# Patient Record
Sex: Female | Born: 1965 | Race: White | Hispanic: No | Smoking: Never smoker
Health system: Southern US, Community
[De-identification: ages and names within clinical notes are randomized; demographics above are authoritative.]

## PROBLEM LIST (undated history)

## (undated) DIAGNOSIS — Z8709 Personal history of other diseases of the respiratory system: Secondary | ICD-10-CM

## (undated) DIAGNOSIS — J302 Other seasonal allergic rhinitis: Secondary | ICD-10-CM

## (undated) DIAGNOSIS — B019 Varicella without complication: Secondary | ICD-10-CM

## (undated) DIAGNOSIS — H3320 Serous retinal detachment, unspecified eye: Secondary | ICD-10-CM

## (undated) DIAGNOSIS — N390 Urinary tract infection, site not specified: Secondary | ICD-10-CM

## (undated) DIAGNOSIS — R51 Headache: Secondary | ICD-10-CM

## (undated) DIAGNOSIS — E039 Hypothyroidism, unspecified: Secondary | ICD-10-CM

## (undated) DIAGNOSIS — R519 Headache, unspecified: Secondary | ICD-10-CM

## (undated) HISTORY — PX: ELBOW SURGERY: SHX618

## (undated) HISTORY — DX: Urinary tract infection, site not specified: N39.0

## (undated) HISTORY — DX: Other seasonal allergic rhinitis: J30.2

## (undated) HISTORY — DX: Varicella without complication: B01.9

## (undated) HISTORY — PX: TUBAL LIGATION: SHX77

---

## 2007-11-28 ENCOUNTER — Emergency Department (HOSPITAL_COMMUNITY): Admission: EM | Admit: 2007-11-28 | Discharge: 2007-11-28 | Payer: Self-pay | Admitting: Emergency Medicine

## 2010-03-21 ENCOUNTER — Emergency Department (HOSPITAL_COMMUNITY): Admission: EM | Admit: 2010-03-21 | Discharge: 2010-03-21 | Payer: Self-pay | Admitting: Emergency Medicine

## 2010-07-02 ENCOUNTER — Ambulatory Visit (HOSPITAL_COMMUNITY): Admission: RE | Admit: 2010-07-02 | Payer: Self-pay | Source: Home / Self Care | Admitting: Obstetrics and Gynecology

## 2010-09-19 LAB — URINE CULTURE
Colony Count: >=100000
Culture  Setup Time: 201109152007

## 2010-09-19 LAB — COMPREHENSIVE METABOLIC PANEL WITH GFR
ALT: 17 U/L (ref 0–35)
AST: 21 U/L (ref 0–37)
Albumin: 3.9 g/dL (ref 3.5–5.2)
Alkaline Phosphatase: 78 U/L (ref 39–117)
BUN: 8 mg/dL (ref 6–23)
CO2: 23 meq/L (ref 19–32)
Calcium: 9.4 mg/dL (ref 8.4–10.5)
Chloride: 108 meq/L (ref 96–112)
Creatinine, Ser: 0.82 mg/dL (ref 0.4–1.2)
GFR calc Af Amer: >60 mL/min (ref >60)
GFR calc non Af Amer: >60 mL/min (ref >60)
Glucose, Bld: 83 mg/dL (ref 70–99)
Potassium: 3.6 meq/L (ref 3.5–5.1)
Sodium: 139 meq/L (ref 135–145)
Total Bilirubin: 0.4 mg/dL (ref 0.3–1.2)
Total Protein: 7.2 g/dL (ref 6.0–8.3)

## 2010-09-19 LAB — PREGNANCY, URINE: Preg Test, Ur: NEGATIVE

## 2010-09-19 LAB — CBC
HCT: 39.4 % (ref 36.0–46.0)
Hemoglobin: 13.8 g/dL (ref 12.0–15.0)
MCH: 30.7 pg (ref 26.0–34.0)
MCHC: 35 g/dL (ref 30.0–36.0)
MCV: 87.6 fL (ref 78.0–100.0)
Platelets: 338 K/uL (ref 150–400)
RBC: 4.5 MIL/uL (ref 3.87–5.11)
RDW: 13.1 % (ref 11.5–15.5)
WBC: 10.7 K/uL — ABNORMAL HIGH (ref 4.0–10.5)

## 2010-09-19 LAB — DIFFERENTIAL
Basophils Absolute: 0.1 K/uL (ref 0.0–0.1)
Basophils Relative: 1 % (ref 0–1)
Eosinophils Absolute: 0.2 K/uL (ref 0.0–0.7)
Eosinophils Relative: 2 % (ref 0–5)
Lymphocytes Relative: 27 % (ref 12–46)
Lymphs Abs: 2.9 K/uL (ref 0.7–4.0)
Monocytes Absolute: 0.8 K/uL (ref 0.1–1.0)
Monocytes Relative: 8 % (ref 3–12)
Neutro Abs: 6.8 K/uL (ref 1.7–7.7)
Neutrophils Relative %: 63 % (ref 43–77)

## 2010-09-19 LAB — GC/CHLAMYDIA PROBE AMP, GENITAL
Chlamydia, DNA Probe: NEGATIVE
GC Probe Amp, Genital: NEGATIVE

## 2010-09-19 LAB — URINALYSIS, ROUTINE W REFLEX MICROSCOPIC
Bilirubin Urine: NEGATIVE
Specific Gravity, Urine: 1.008 (ref 1.005–1.030)
pH: 6.5 (ref 5.0–8.0)

## 2010-09-19 LAB — URINE MICROSCOPIC-ADD ON

## 2010-09-19 LAB — LIPASE, BLOOD: Lipase: 25 U/L (ref 11–59)

## 2010-09-19 LAB — WET PREP, GENITAL: Clue Cells Wet Prep HPF POC: NONE SEEN

## 2011-06-13 ENCOUNTER — Encounter: Payer: Self-pay | Admitting: Emergency Medicine

## 2011-06-13 ENCOUNTER — Emergency Department (HOSPITAL_COMMUNITY)
Admission: EM | Admit: 2011-06-13 | Discharge: 2011-06-13 | Disposition: A | Discharge status: Home / Self Care | Payer: Self-pay | Attending: Emergency Medicine | Admitting: Emergency Medicine

## 2011-06-13 ENCOUNTER — Emergency Department (HOSPITAL_COMMUNITY): Payer: Self-pay

## 2011-06-13 DIAGNOSIS — J069 Acute upper respiratory infection, unspecified: Secondary | ICD-10-CM | POA: Insufficient documentation

## 2011-06-13 DIAGNOSIS — J40 Bronchitis, not specified as acute or chronic: Secondary | ICD-10-CM | POA: Insufficient documentation

## 2011-06-13 DIAGNOSIS — R079 Chest pain, unspecified: Secondary | ICD-10-CM | POA: Insufficient documentation

## 2011-06-13 MED ORDER — PROMETHAZINE-DM 6.25-15 MG/5ML PO SYRP: 5.0000 mL | ORAL_SOLUTION | Freq: Four times a day (QID) | ORAL | Status: AC | PRN

## 2011-06-13 MED ORDER — PREDNISONE 50 MG PO TABS: 50.0000 mg | ORAL_TABLET | Freq: Every day | ORAL | Status: AC

## 2011-06-13 MED ORDER — GUAIFENESIN ER 1200 MG PO TB12: 1.0000 | ORAL_TABLET | Freq: Two times a day (BID) | ORAL | Status: DC

## 2011-06-13 NOTE — ED Provider Notes (Signed)
History     CSN: [id number] Arrival date & time: 06/13/2011 12:31 PM   First MD Initiated Contact with Patient 06/13/11 1355      Chief Complaint  Patient presents with  . Cough  . Otalgia  . Nausea    (Consider location/radiation/quality/duration/timing/severity/associated sxs/prior treatment) The history is provided by the patient.    45 y.o. female presents to the emergency room c/o fever, headache, cough, congestion, otalgia, and sore throat x 3 days.  Denies nausea, vomiting, diarrhea, abdominal pain.  Pt admits to chest wall soreness with coughing and mild shortness of breath on exertion.  No sick contacts.  Decreased PO intake, fatigue and myalgias.   Past Medical History  Diagnosis Date  . Asthma     Past Surgical History  Procedure Date  . Elbow surgery   . Tubal ligation     History reviewed. No pertinent family history.  History  Substance Use Topics  . Smoking status: Never Smoker   . Smokeless tobacco: Not on file  . Alcohol Use: No    OB History    Grav Para Term Preterm Abortions TAB SAB Ect Mult Living                  Review of Systems  All pertinent positives and negatives in the history of present illness  Allergies  Aspirin and Codeine  Home Medications   Current Outpatient Rx  Name Route Sig Dispense Refill  . ACETAMINOPHEN 80 MG PO CHEW Oral Chew 160 mg by mouth every 4 (four) hours as needed. pain     . ALBUTEROL SULFATE HFA 108 (90 BASE) MCG/ACT IN AERS Inhalation Inhale 2 puffs into the lungs every 6 (six) hours as needed. Shortness of breath     . PHENYLEPHRINE-DM-GG 2.5-5-100 MG/5ML PO LIQD Oral Take 10 mLs by mouth every 6 (six) hours as needed. Cough/congestion       BP 150/85  Pulse 120  Temp(Src) 98.4 F (36.9 C) (Oral)  Resp 16  SpO2 97%  LMP 05/13/2011  Physical Exam  Constitutional: She is oriented to person, place, and time. She appears well-developed and well-nourished.  HENT:  Head: Normocephalic and  atraumatic.  Right Ear: Tympanic membrane, external ear and ear canal normal.  Left Ear: Tympanic membrane, external ear and ear canal normal.  Nose: Mucosal edema and rhinorrhea present. No sinus tenderness. No epistaxis. Right sinus exhibits maxillary sinus tenderness and frontal sinus tenderness. Left sinus exhibits maxillary sinus tenderness and frontal sinus tenderness.  Mouth/Throat: Uvula is midline, oropharynx is clear and moist and mucous membranes are normal.  Eyes: Pupils are equal, round, and reactive to light.  Neck: Normal range of motion. No JVD present.  Cardiovascular: Normal rate, regular rhythm and normal heart sounds.   Pulmonary/Chest: Effort normal and breath sounds normal. No accessory muscle usage. No respiratory distress. She has no wheezes. She has no rhonchi. She has no rales. She exhibits tenderness (chest wall sore with inspiration).  Abdominal: Soft. Bowel sounds are normal.  Lymphadenopathy:    She has no cervical adenopathy.  Neurological: She is alert and oriented to person, place, and time.  Skin: Skin is warm and dry.  Psychiatric: She has a normal mood and affect. Her behavior is normal. Judgment and thought content normal.    ED Course  Procedures (including critical care time)     Patient is a negative chest x-ray he will be treated for viral upper respiratory tract infection.  She does have a  history of asthmatic bronchitis which she is on albuterol inhaler.      MDM  Viral URI based on her history of present illness and physical exam        Carlyle Dolly, PA-C 06/14/11 [phone]

## 2011-06-13 NOTE — ED Notes (Addendum)
Pt said that her symptoms started Tuesday with a  Fever, body aches, and coughs. She feels nauseated because she coughs and back pain when she coughs. Hx of bronchial asthma.

## 2011-06-16 NOTE — ED Provider Notes (Signed)
Medical screening examination/treatment/procedure(s) were performed by non-physician practitioner and as supervising physician I was immediately available for consultation/collaboration.  Doug Sou, MD 06/16/11 [phone]

## 2014-07-25 ENCOUNTER — Encounter: Payer: Self-pay | Admitting: Certified Nurse Midwife

## 2014-08-31 ENCOUNTER — Encounter: Payer: Self-pay | Admitting: Nurse Practitioner

## 2014-09-13 ENCOUNTER — Other Ambulatory Visit: Payer: Self-pay | Admitting: Obstetrics and Gynecology

## 2014-09-13 DIAGNOSIS — R928 Other abnormal and inconclusive findings on diagnostic imaging of breast: Secondary | ICD-10-CM

## 2014-09-19 ENCOUNTER — Ambulatory Visit
Admission: RE | Admit: 2014-09-19 | Discharge: 2014-09-19 | Disposition: A | Discharge status: Home / Self Care | Payer: 59 | Source: Ambulatory Visit | Attending: Obstetrics and Gynecology | Admitting: Obstetrics and Gynecology

## 2014-09-19 DIAGNOSIS — R928 Other abnormal and inconclusive findings on diagnostic imaging of breast: Secondary | ICD-10-CM

## 2014-10-16 ENCOUNTER — Ambulatory Visit (INDEPENDENT_AMBULATORY_CARE_PROVIDER_SITE_OTHER): Payer: 59 | Admitting: Family

## 2014-10-16 ENCOUNTER — Telehealth: Payer: Self-pay | Admitting: Family

## 2014-10-16 ENCOUNTER — Encounter: Payer: Self-pay | Admitting: Family

## 2014-10-16 ENCOUNTER — Other Ambulatory Visit (INDEPENDENT_AMBULATORY_CARE_PROVIDER_SITE_OTHER): Payer: 59

## 2014-10-16 VITALS — BP 128/88 | HR 78 | Temp 98.5°F | Resp 18 | Ht 61.75 in | Wt 175.8 lb

## 2014-10-16 DIAGNOSIS — R946 Abnormal results of thyroid function studies: Secondary | ICD-10-CM | POA: Diagnosis not present

## 2014-10-16 DIAGNOSIS — J4520 Mild intermittent asthma, uncomplicated: Secondary | ICD-10-CM | POA: Diagnosis not present

## 2014-10-16 DIAGNOSIS — E039 Hypothyroidism, unspecified: Secondary | ICD-10-CM | POA: Insufficient documentation

## 2014-10-16 DIAGNOSIS — R7989 Other specified abnormal findings of blood chemistry: Secondary | ICD-10-CM

## 2014-10-16 DIAGNOSIS — R21 Rash and other nonspecific skin eruption: Secondary | ICD-10-CM | POA: Diagnosis not present

## 2014-10-16 DIAGNOSIS — J45909 Unspecified asthma, uncomplicated: Secondary | ICD-10-CM | POA: Insufficient documentation

## 2014-10-16 LAB — TSH: TSH: 5.59 u[IU]/mL — AB (ref 0.35–4.50)

## 2014-10-16 LAB — T4: T4, Total: 7.3 ug/dL (ref 4.5–12.0)

## 2014-10-16 MED ORDER — ALBUTEROL SULFATE HFA 108 (90 BASE) MCG/ACT IN AERS: 1.0000 | INHALATION_SPRAY | Freq: Four times a day (QID) | RESPIRATORY_TRACT | Status: DC | PRN

## 2014-10-16 MED ORDER — SYNTHROID 25 MCG PO TABS: 25.0000 ug | ORAL_TABLET | Freq: Every day | ORAL | Status: DC

## 2014-10-16 NOTE — Progress Notes (Signed)
Pre visit review using our clinic review tool, if applicable. No additional management support is needed unless otherwise documented below in the visit note. 

## 2014-10-16 NOTE — Telephone Encounter (Signed)
Pt aware of results and she is aware to follow up in 6 weeks.

## 2014-10-16 NOTE — Patient Instructions (Signed)
Thank you for choosing [facility].  Summary/Instructions:  Your prescription(s) have been submitted to your pharmacy or been printed and provided for you. Please take as directed and contact our office if you believe you are having problem(s) with the medication(s) or have any questions.  Please stop by the lab on the basement level of the building for your blood work. Your results will be released to [facility] (or called to you) after review, usually within 72 hours after test completion. If any changes need to be made, you will be notified at that same time.  If your symptoms worsen or fail to improve, please contact our office for further instruction, or in case of emergency go directly to the emergency room at the closest medical facility.    Hypothyroidism The thyroid is a large gland located in the lower front of your neck. The thyroid gland helps control metabolism. Metabolism is how your body handles food. It controls metabolism with the hormone thyroxine. When this gland is underactive (hypothyroid), it produces too little hormone.  CAUSES These include:   Absence or destruction of thyroid tissue.  Goiter due to iodine deficiency.  Goiter due to medications.  Congenital defects (since birth).  Problems with the pituitary. This causes a lack of TSH (thyroid stimulating hormone). This hormone tells the thyroid to turn out more hormone. SYMPTOMS  Lethargy (feeling as though you have no energy)  Cold intolerance  Weight gain (in spite of normal food intake)  Dry skin  Coarse hair  Menstrual irregularity (if severe, may lead to infertility)  Slowing of thought processes Cardiac problems are also caused by insufficient amounts of thyroid hormone. Hypothyroidism in the newborn is cretinism, and is an extreme form. It is important that this form be treated adequately and immediately or it will lead rapidly to retarded physical and mental development. DIAGNOSIS  To  prove hypothyroidism, your caregiver may do blood tests and ultrasound tests. Sometimes the signs are hidden. It may be necessary for your caregiver to watch this illness with blood tests either before or after diagnosis and treatment. TREATMENT  Low levels of thyroid hormone are increased by using synthetic thyroid hormone. This is a safe, effective treatment. It usually takes about four weeks to gain the full effects of the medication. After you have the full effect of the medication, it will generally take another four weeks for problems to leave. Your caregiver may start you on low doses. If you have had heart problems the dose may be gradually increased. It is generally not an emergency to get rapidly to normal. HOME CARE INSTRUCTIONS   Take your medications as your caregiver suggests. Let your caregiver know of any medications you are taking or start taking. Your caregiver will help you with dosage schedules.  As your condition improves, your dosage needs may increase. It will be necessary to have continuing blood tests as suggested by your caregiver.  Report all suspected medication side effects to your caregiver. SEEK MEDICAL CARE IF: Seek medical care if you develop:  Sweating.  Tremulousness (tremors).  Anxiety.  Rapid weight loss.  Heat intolerance.  Emotional swings.  Diarrhea.  Weakness. SEEK IMMEDIATE MEDICAL CARE IF:  You develop chest pain, an irregular heart beat (palpitations), or a rapid heart beat. MAKE SURE YOU:   Understand these instructions.  Will watch your condition.  Will get help right away if you are not doing well or get worse. Document Released: 06/23/2005 Document Revised: 09/15/2011 Document Reviewed: 02/11/2008 ExitCare Patient  Information 2015 [employer]. This information is not intended to replace advice given to you by your health care provider. Make sure you discuss any questions you have with your health care provider.  

## 2014-10-16 NOTE — Assessment & Plan Note (Signed)
Stable and controlled with albuterol. Continue current dosage of albuterol. Medication refill.

## 2014-10-16 NOTE — Assessment & Plan Note (Signed)
Previously noted elevated TSH from her GYN office. Obtain TSH, T3, and T4 to determine thyroid status. Treatment pending lab work results.

## 2014-10-16 NOTE — Progress Notes (Signed)
Subjective:    Patient ID: Kristi Hendrix, female    DOB: August 29, 1965, 49 y.o.   MRN: [id number]  Chief Complaint  Patient presents with  . Establish Care    was told at GYN she has hypothyroidism and needs to get it treated for hysterectomy, also has a sore on chest that has been there x6 months, wants it to be checked out    HPI:  Kristi Hendrix is a 49 y.o. female who presents today     1) Asthma - Previously diagnosed with asthma. Notes that she uses her albuterol inhaler mainly during the summer and about 1x per week. Exacerbated by the heat and humdity.  2) Thyroid -  Associated symptom of menopausal symptoms has been going on for a while. She also notes mood changes, weight gain and sweating. She was recently tested for hypothyroid and found to have a high TSH which she does not recall the number.  3) Sore on chest - Associated symptom of a sore located in the middle of her chest for about 6 months. It started off as a bump and got larger in height and size. Indicates it felt like a scar. Now slowly decreasing in size with the application of neosporin.    Allergies  Allergen Reactions  . Aspirin     Makes pt sleepy  . Codeine Itching    Current Outpatient Prescriptions on File Prior to Visit  Medication Sig Dispense Refill  . albuterol (PROVENTIL HFA;VENTOLIN HFA) 108 (90 BASE) MCG/ACT inhaler Inhale 2 puffs into the lungs every 6 (six) hours as needed. Shortness of breath      No current facility-administered medications on file prior to visit.    Past Medical History  Diagnosis Date  . Asthma   . Chicken pox   . Seasonal allergies   . UTI (lower urinary tract infection)     Past Surgical History  Procedure Laterality Date  . Elbow surgery    . Tubal ligation      Family History  Problem Relation Age of Onset  . Alcohol abuse Mother   . Alcohol abuse Father   . Diabetes Father   . Heart disease Maternal Grandfather     History   Social  History  . Marital Status: Married    Spouse Name: N/A  . Number of Children: 2  . Years of Education: 12   Occupational History  . Driver    Social History Main Topics  . Smoking status: Never Smoker   . Smokeless tobacco: Never Used  . Alcohol Use: Yes     Comment: Social drinker  . Drug Use: No  . Sexual Activity: Not on file   Other Topics Concern  . Not on file   Social History Narrative   Currently resides with husband. Fun: Hike, camp, fish and work in her yard.   Denies religious beliefs effecting healthcare.     Review of Systems  Constitutional: Positive for unexpected weight change. Negative for fever, activity change and appetite change.  Endocrine: Positive for cold intolerance. Negative for heat intolerance.  Hematological: Does not bruise/bleed easily.      Objective:    BP 128/88 mmHg  Pulse 78  Temp(Src) 98.5 F (36.9 C) (Oral)  Resp 18  Ht 5' 1.75" (1.568 m)  Wt 175 lb 12.8 oz (79.742 kg)  BMI 32.43 kg/m2  SpO2 98% Nursing note and vital signs reviewed.  Physical Exam  Constitutional: She is oriented to person,  place, and time. She appears well-developed and well-nourished. No distress.  Neck: No thyromegaly present.  Cardiovascular: Normal rate, regular rhythm, normal heart sounds and intact distal pulses.   Pulmonary/Chest: Effort normal and breath sounds normal.  Neurological: She is alert and oriented to person, place, and time.  Skin: Skin is warm and dry.  2 mm papule with well-defined borders and scab on top located inferior to her left clavicle.  Psychiatric: She has a normal mood and affect. Her behavior is normal. Judgment and thought content normal.       Assessment & Plan:

## 2014-10-16 NOTE — Assessment & Plan Note (Signed)
Rash consistent with healing boil. Continue to keep clean and dry. Apply Neosporin as needed. Follow-up if symptoms worsen or fail to improve.

## 2014-10-16 NOTE — Telephone Encounter (Signed)
Please inform the patient that her TSH was 5.59 which is considered elevated and meaning potential hypothyroidism. I have sent in a prescription to her pharmacy for levothyroxine for her to start. I'd like her to follow up in 6 weeks to determine effectiveness.

## 2014-10-17 LAB — T3: T3, Total: 122.7 ng/dL (ref 80.0–204.0)

## 2014-10-24 ENCOUNTER — Other Ambulatory Visit (HOSPITAL_COMMUNITY): Payer: Self-pay | Admitting: Obstetrics and Gynecology

## 2014-11-13 ENCOUNTER — Telehealth: Payer: Self-pay | Admitting: Family

## 2014-11-13 DIAGNOSIS — R7989 Other specified abnormal findings of blood chemistry: Secondary | ICD-10-CM

## 2014-11-13 MED ORDER — SYNTHROID 25 MCG PO TABS: 25.0000 ug | ORAL_TABLET | Freq: Every day | ORAL | Status: DC

## 2014-11-13 NOTE — Telephone Encounter (Signed)
Pt aware.

## 2014-11-13 NOTE — Telephone Encounter (Signed)
Pt called in and needs refill on her SYNTHROID 25 MCG tablet [[id number]]

## 2014-11-13 NOTE — Telephone Encounter (Signed)
Rx sent in

## 2014-11-20 NOTE — Patient Instructions (Addendum)
   Your procedure is scheduled on:  Monday, May 23  Enter through the Main Entrance of [facility] at: 6 AM Pick up the phone at the desk and dial [phone] and inform us of your arrival.  Please call this number if you have any problems the morning of surgery: [phone]  Remember: Do not eat or drink after midnight: Sunday Take these medicines the morning of surgery with a SIP OF WATER:  Synthroid.  Bring albuterol inhaler with you on day of surgery.  Do not wear jewelry, make-up, or FINGER nail polish No metal in your hair or on your body. Do not wear lotions, powders, perfumes.  You may wear deodorant.  Do not bring valuables to the hospital. Contacts, dentures or bridgework may not be worn into surgery.  Leave suitcase in the car. After Surgery it may be brought to your room. For patients being admitted to the hospital, checkout time is 11:00am the day of discharge.  Home with husband Herbie [place] cell [phone]

## 2014-11-21 ENCOUNTER — Encounter (HOSPITAL_COMMUNITY)
Admission: RE | Admit: 2014-11-21 | Discharge: 2014-11-21 | Disposition: A | Discharge status: Home / Self Care | Payer: 59 | Source: Ambulatory Visit | Attending: Obstetrics and Gynecology | Admitting: Obstetrics and Gynecology

## 2014-11-21 ENCOUNTER — Other Ambulatory Visit (INDEPENDENT_AMBULATORY_CARE_PROVIDER_SITE_OTHER): Payer: 59

## 2014-11-21 ENCOUNTER — Encounter (HOSPITAL_COMMUNITY): Payer: Self-pay

## 2014-11-21 ENCOUNTER — Telehealth: Payer: Self-pay | Admitting: Family

## 2014-11-21 DIAGNOSIS — Z01812 Encounter for preprocedural laboratory examination: Secondary | ICD-10-CM | POA: Insufficient documentation

## 2014-11-21 DIAGNOSIS — R946 Abnormal results of thyroid function studies: Secondary | ICD-10-CM

## 2014-11-21 DIAGNOSIS — R7989 Other specified abnormal findings of blood chemistry: Secondary | ICD-10-CM

## 2014-11-21 HISTORY — DX: Serous retinal detachment, unspecified eye: H33.20

## 2014-11-21 HISTORY — DX: Headache, unspecified: R51.9

## 2014-11-21 HISTORY — DX: Headache: R51

## 2014-11-21 HISTORY — DX: Personal history of other diseases of the respiratory system: Z87.09

## 2014-11-21 HISTORY — DX: Hypothyroidism, unspecified: E03.9

## 2014-11-21 LAB — URINALYSIS, ROUTINE W REFLEX MICROSCOPIC
Bilirubin Urine: NEGATIVE
Glucose, UA: NEGATIVE mg/dL
Ketones, ur: NEGATIVE mg/dL
Leukocytes, UA: NEGATIVE
Nitrite: NEGATIVE
Protein, ur: NEGATIVE mg/dL
SPECIFIC GRAVITY, URINE: 1.025 (ref 1.005–1.030)
Urobilinogen, UA: 0.2 mg/dL (ref 0.0–1.0)
pH: 5.5 (ref 5.0–8.0)

## 2014-11-21 LAB — CBC WITH DIFFERENTIAL/PLATELET
BASOS PCT: 0 % (ref 0–1)
Basophils Absolute: 0 10*3/uL (ref 0.0–0.1)
EOS ABS: 0.3 10*3/uL (ref 0.0–0.7)
Eosinophils Relative: 4 % (ref 0–5)
HCT: 38.7 % (ref 36.0–46.0)
Hemoglobin: 13.3 g/dL (ref 12.0–15.0)
Lymphocytes Relative: 34 % (ref 12–46)
Lymphs Abs: 2.7 10*3/uL (ref 0.7–4.0)
MCH: 29.7 pg (ref 26.0–34.0)
MCHC: 34.4 g/dL (ref 30.0–36.0)
MCV: 86.4 fL (ref 78.0–100.0)
Monocytes Absolute: 0.4 10*3/uL (ref 0.1–1.0)
Monocytes Relative: 5 % (ref 3–12)
NEUTROS ABS: 4.5 10*3/uL (ref 1.7–7.7)
NEUTROS PCT: 57 % (ref 43–77)
Platelets: 394 10*3/uL (ref 150–400)
RBC: 4.48 MIL/uL (ref 3.87–5.11)
RDW: 14.4 % (ref 11.5–15.5)
WBC: 7.9 10*3/uL (ref 4.0–10.5)

## 2014-11-21 LAB — TSH: TSH: 2.6 u[IU]/mL (ref 0.35–4.50)

## 2014-11-21 LAB — COMPREHENSIVE METABOLIC PANEL
ALBUMIN: 3.8 g/dL (ref 3.5–5.0)
ALT: 16 U/L (ref 14–54)
ANION GAP: 7 (ref 5–15)
AST: 21 U/L (ref 15–41)
Alkaline Phosphatase: 83 U/L (ref 38–126)
BILIRUBIN TOTAL: 0.2 mg/dL — AB (ref 0.3–1.2)
BUN: 10 mg/dL (ref 6–20)
CALCIUM: 8.9 mg/dL (ref 8.9–10.3)
CHLORIDE: 108 mmol/L (ref 101–111)
CO2: 23 mmol/L (ref 22–32)
CREATININE: 0.9 mg/dL (ref 0.44–1.00)
GFR calc Af Amer: >60 mL/min (ref >60)
GFR calc non Af Amer: >60 mL/min (ref >60)
Glucose, Bld: 97 mg/dL (ref 65–99)
Potassium: 3.8 mmol/L (ref 3.5–5.1)
Sodium: 138 mmol/L (ref 135–145)
Total Protein: 7.6 g/dL (ref 6.5–8.1)

## 2014-11-21 LAB — URINE MICROSCOPIC-ADD ON

## 2014-11-21 NOTE — Telephone Encounter (Signed)
Please inform the patient that her TSH has dropped from 5.5 to 2.6. She should be feeling a little better with this change. Please continue to take the levothyroxine at the same dose and we will recheck in 3 months.

## 2014-11-22 NOTE — Telephone Encounter (Signed)
Pt aware.

## 2014-11-26 NOTE — Anesthesia Preprocedure Evaluation (Addendum)
Anesthesia Evaluation  Patient identified by MRN, date of birth, ID band Patient awake    Reviewed: Allergy & Precautions, NPO status , Patient's Chart, lab work & pertinent test results  History of Anesthesia Complications Negative for: history of anesthetic complications  Airway Mallampati: III  TM Distance: >3 FB Neck ROM: Full    Dental no notable dental hx. (+) Dental Advisory Given, Poor Dentition   Pulmonary asthma ,  breath sounds clear to auscultation  Pulmonary exam normal       Cardiovascular negative cardio ROS Normal cardiovascular examRhythm:Regular Rate:Normal     Neuro/Psych  Headaches, negative psych ROS   GI/Hepatic negative GI ROS, Neg liver ROS,   Endo/Other  Hypothyroidism obesity  Renal/GU negative Renal ROS  negative genitourinary   Musculoskeletal negative musculoskeletal ROS (+)   Abdominal   Peds negative pediatric ROS (+)  Hematology negative hematology ROS (+)   Anesthesia Other Findings   Reproductive/Obstetrics negative OB ROS                            Anesthesia Physical Anesthesia Plan  ASA: II  Anesthesia Plan: General   Post-op Pain Management:    Induction: Intravenous  Airway Management Planned: Oral ETT  Additional Equipment:   Intra-op Plan:   Post-operative Plan: Extubation in OR  Informed Consent: I have reviewed the patients History and Physical, chart, labs and discussed the procedure including the risks, benefits and alternatives for the proposed anesthesia with the patient or authorized representative who has indicated his/her understanding and acceptance.   Dental advisory given  Plan Discussed with: CRNA  Anesthesia Plan Comments:         Anesthesia Quick Evaluation

## 2014-11-27 ENCOUNTER — Encounter (HOSPITAL_COMMUNITY)
Admission: RE | Disposition: A | Discharge status: Home / Self Care | Payer: Self-pay | Source: Ambulatory Visit | Attending: Obstetrics and Gynecology

## 2014-11-27 ENCOUNTER — Encounter (HOSPITAL_COMMUNITY): Payer: Self-pay | Admitting: Registered Nurse

## 2014-11-27 ENCOUNTER — Inpatient Hospital Stay (HOSPITAL_COMMUNITY): Payer: 59 | Admitting: Anesthesiology

## 2014-11-27 ENCOUNTER — Observation Stay (HOSPITAL_COMMUNITY)
Admission: RE | Admit: 2014-11-27 | Discharge: 2014-11-28 | Disposition: A | Discharge status: Home / Self Care | Payer: 59 | Source: Ambulatory Visit | Attending: Obstetrics and Gynecology | Admitting: Obstetrics and Gynecology

## 2014-11-27 DIAGNOSIS — Z888 Allergy status to other drugs, medicaments and biological substances status: Secondary | ICD-10-CM | POA: Insufficient documentation

## 2014-11-27 DIAGNOSIS — Z886 Allergy status to analgesic agent status: Secondary | ICD-10-CM | POA: Insufficient documentation

## 2014-11-27 DIAGNOSIS — N920 Excessive and frequent menstruation with regular cycle: Principal | ICD-10-CM | POA: Insufficient documentation

## 2014-11-27 DIAGNOSIS — N800 Endometriosis of uterus: Secondary | ICD-10-CM | POA: Diagnosis not present

## 2014-11-27 DIAGNOSIS — D259 Leiomyoma of uterus, unspecified: Secondary | ICD-10-CM | POA: Insufficient documentation

## 2014-11-27 DIAGNOSIS — Z9851 Tubal ligation status: Secondary | ICD-10-CM | POA: Insufficient documentation

## 2014-11-27 DIAGNOSIS — N946 Dysmenorrhea, unspecified: Secondary | ICD-10-CM | POA: Diagnosis not present

## 2014-11-27 DIAGNOSIS — J4520 Mild intermittent asthma, uncomplicated: Secondary | ICD-10-CM

## 2014-11-27 DIAGNOSIS — Z90710 Acquired absence of both cervix and uterus: Secondary | ICD-10-CM | POA: Diagnosis present

## 2014-11-27 HISTORY — PX: VAGINAL HYSTERECTOMY: SHX2639

## 2014-11-27 LAB — PREGNANCY, URINE: Preg Test, Ur: NEGATIVE

## 2014-11-27 SURGERY — HYSTERECTOMY, VAGINAL
Anesthesia: General

## 2014-11-27 MED ORDER — HYDROMORPHONE HCL 1 MG/ML IJ SOLN
0.2500 mg | INTRAMUSCULAR | Status: DC | PRN
  Administered 2014-11-27 (×4): 0.5 mg via INTRAVENOUS

## 2014-11-27 MED ORDER — SODIUM CHLORIDE 0.9 % IJ SOLN
INTRAMUSCULAR | Status: DC | PRN
  Administered 2014-11-27: 250 mL

## 2014-11-27 MED ORDER — GLYCOPYRROLATE 0.2 MG/ML IJ SOLN
INTRAMUSCULAR | Status: AC
  Filled 2014-11-27: qty 2

## 2014-11-27 MED ORDER — MIDAZOLAM HCL 5 MG/5ML IJ SOLN
INTRAMUSCULAR | Status: DC | PRN
  Administered 2014-11-27: 2 mg via INTRAVENOUS

## 2014-11-27 MED ORDER — ONDANSETRON HCL 4 MG/2ML IJ SOLN: 4.0000 mg | Freq: Once | INTRAMUSCULAR | Status: DC | PRN

## 2014-11-27 MED ORDER — PROPOFOL 10 MG/ML IV BOLUS
INTRAVENOUS | Status: DC | PRN
  Administered 2014-11-27: 170 mg via INTRAVENOUS

## 2014-11-27 MED ORDER — HEPARIN SODIUM (PORCINE) 5000 UNIT/ML IJ SOLN
5000.0000 [IU] | INTRAMUSCULAR | Status: AC
  Administered 2014-11-27: 5000 [IU] via SUBCUTANEOUS

## 2014-11-27 MED ORDER — ACETAMINOPHEN 10 MG/ML IV SOLN
1000.0000 mg | Freq: Once | INTRAVENOUS | Status: AC
  Administered 2014-11-27: 1000 mg via INTRAVENOUS
  Filled 2014-11-27: qty 100

## 2014-11-27 MED ORDER — METOCLOPRAMIDE HCL 5 MG/ML IJ SOLN
10.0000 mg | Freq: Once | INTRAMUSCULAR | Status: AC
  Administered 2014-11-27: 10 mg via INTRAVENOUS
  Filled 2014-11-27: qty 2

## 2014-11-27 MED ORDER — SCOPOLAMINE 1 MG/3DAYS TD PT72
MEDICATED_PATCH | TRANSDERMAL | Status: AC
  Filled 2014-11-27: qty 1

## 2014-11-27 MED ORDER — PHENYLEPHRINE HCL 10 MG/ML IJ SOLN
INTRAMUSCULAR | Status: AC
Start: 2014-11-27 — End: 2014-11-28
  Filled 2014-11-27: qty 0.4

## 2014-11-27 MED ORDER — HYDROMORPHONE HCL 1 MG/ML IJ SOLN
INTRAMUSCULAR | Status: AC
  Filled 2014-11-27: qty 1

## 2014-11-27 MED ORDER — PROPOFOL 10 MG/ML IV BOLUS
INTRAVENOUS | Status: AC
  Filled 2014-11-27: qty 20

## 2014-11-27 MED ORDER — HEPARIN SODIUM (PORCINE) 5000 UNIT/ML IJ SOLN
INTRAMUSCULAR | Status: AC
  Filled 2014-11-27: qty 1

## 2014-11-27 MED ORDER — CEFAZOLIN SODIUM-DEXTROSE 2-3 GM-% IV SOLR
INTRAVENOUS | Status: AC
  Filled 2014-11-27: qty 50

## 2014-11-27 MED ORDER — SUCCINYLCHOLINE CHLORIDE 20 MG/ML IJ SOLN
INTRAMUSCULAR | Status: DC | PRN
  Administered 2014-11-27: 140 mg via INTRAVENOUS

## 2014-11-27 MED ORDER — LACTATED RINGERS IV SOLN
INTRAVENOUS | Status: DC
  Administered 2014-11-27 (×2): via INTRAVENOUS

## 2014-11-27 MED ORDER — FENTANYL CITRATE (PF) 100 MCG/2ML IJ SOLN
INTRAMUSCULAR | Status: DC | PRN
  Administered 2014-11-27: 50 ug via INTRAVENOUS
  Administered 2014-11-27: 100 ug via INTRAVENOUS
  Administered 2014-11-27 (×5): 50 ug via INTRAVENOUS

## 2014-11-27 MED ORDER — LIDOCAINE HCL (CARDIAC) 20 MG/ML IV SOLN
INTRAVENOUS | Status: AC
  Filled 2014-11-27: qty 5

## 2014-11-27 MED ORDER — SODIUM CHLORIDE 0.9 % IJ SOLN: 9.0000 mL | INTRAMUSCULAR | Status: DC | PRN

## 2014-11-27 MED ORDER — CEFAZOLIN SODIUM-DEXTROSE 2-3 GM-% IV SOLR
2.0000 g | Freq: Three times a day (TID) | INTRAVENOUS | Status: AC
  Administered 2014-11-27 (×2): 2 g via INTRAVENOUS
  Filled 2014-11-27 (×2): qty 50

## 2014-11-27 MED ORDER — LEVOTHYROXINE SODIUM 25 MCG PO TABS
25.0000 ug | ORAL_TABLET | Freq: Every day | ORAL | Status: DC
  Administered 2014-11-28: 25 ug via ORAL
  Filled 2014-11-27: qty 1

## 2014-11-27 MED ORDER — NEOSTIGMINE METHYLSULFATE 10 MG/10ML IV SOLN
INTRAVENOUS | Status: DC | PRN
  Administered 2014-11-27: 2 mg via INTRAVENOUS

## 2014-11-27 MED ORDER — HEPARIN SODIUM (PORCINE) 5000 UNIT/ML IJ SOLN
5000.0000 [IU] | Freq: Three times a day (TID) | INTRAMUSCULAR | Status: AC
  Administered 2014-11-27 – 2014-11-28 (×3): 5000 [IU] via SUBCUTANEOUS
  Filled 2014-11-27 (×3): qty 1

## 2014-11-27 MED ORDER — PNEUMOCOCCAL VAC POLYVALENT 25 MCG/0.5ML IJ INJ
0.5000 mL | INJECTION | INTRAMUSCULAR | Status: AC
  Administered 2014-11-28: 0.5 mL via INTRAMUSCULAR
  Filled 2014-11-27: qty 0.5

## 2014-11-27 MED ORDER — FENTANYL CITRATE (PF) 100 MCG/2ML IJ SOLN
INTRAMUSCULAR | Status: AC
  Filled 2014-11-27: qty 2

## 2014-11-27 MED ORDER — ONDANSETRON HCL 4 MG/2ML IJ SOLN
INTRAMUSCULAR | Status: AC
  Filled 2014-11-27: qty 2

## 2014-11-27 MED ORDER — ONDANSETRON HCL 4 MG PO TABS: 4.0000 mg | ORAL_TABLET | Freq: Four times a day (QID) | ORAL | Status: DC | PRN

## 2014-11-27 MED ORDER — DIPHENHYDRAMINE HCL 12.5 MG/5ML PO ELIX: 12.5000 mg | ORAL_SOLUTION | Freq: Four times a day (QID) | ORAL | Status: DC | PRN

## 2014-11-27 MED ORDER — DEXAMETHASONE SODIUM PHOSPHATE 4 MG/ML IJ SOLN
INTRAMUSCULAR | Status: AC
  Filled 2014-11-27: qty 1

## 2014-11-27 MED ORDER — PHENYLEPHRINE HCL 10 MG/ML IJ SOLN: 0.4000 mL | Freq: Once | INTRAMUSCULAR | Status: DC

## 2014-11-27 MED ORDER — LIDOCAINE HCL (CARDIAC) 20 MG/ML IV SOLN
INTRAVENOUS | Status: DC | PRN
  Administered 2014-11-27: 60 mg via INTRAVENOUS

## 2014-11-27 MED ORDER — HYDROMORPHONE 0.3 MG/ML IV SOLN
INTRAVENOUS | Status: DC
  Administered 2014-11-27: 2.19 mg via INTRAVENOUS
  Administered 2014-11-27: 11:00:00 via INTRAVENOUS
  Filled 2014-11-27: qty 25

## 2014-11-27 MED ORDER — SUCCINYLCHOLINE CHLORIDE 20 MG/ML IJ SOLN
INTRAMUSCULAR | Status: AC
  Filled 2014-11-27: qty 10

## 2014-11-27 MED ORDER — SCOPOLAMINE 1 MG/3DAYS TD PT72
1.0000 | MEDICATED_PATCH | Freq: Once | TRANSDERMAL | Status: DC
  Administered 2014-11-27: 1.5 mg via TRANSDERMAL

## 2014-11-27 MED ORDER — DEXAMETHASONE SODIUM PHOSPHATE 4 MG/ML IJ SOLN
INTRAMUSCULAR | Status: DC | PRN
  Administered 2014-11-27: 4 mg via INTRAVENOUS

## 2014-11-27 MED ORDER — ONDANSETRON HCL 4 MG/2ML IJ SOLN
4.0000 mg | Freq: Four times a day (QID) | INTRAMUSCULAR | Status: DC | PRN
  Administered 2014-11-27: 4 mg via INTRAVENOUS
  Filled 2014-11-27: qty 2

## 2014-11-27 MED ORDER — ROCURONIUM BROMIDE 100 MG/10ML IV SOLN
INTRAVENOUS | Status: DC | PRN
  Administered 2014-11-27: 30 mg via INTRAVENOUS

## 2014-11-27 MED ORDER — HYDROMORPHONE HCL 2 MG PO TABS
2.0000 mg | ORAL_TABLET | ORAL | Status: DC | PRN
  Administered 2014-11-27 – 2014-11-28 (×4): 2 mg via ORAL
  Filled 2014-11-27 (×4): qty 1

## 2014-11-27 MED ORDER — CEFAZOLIN SODIUM-DEXTROSE 2-3 GM-% IV SOLR
2.0000 g | INTRAVENOUS | Status: AC
  Administered 2014-11-27: 2 g via INTRAVENOUS

## 2014-11-27 MED ORDER — NALOXONE HCL 0.4 MG/ML IJ SOLN: 0.4000 mg | INTRAMUSCULAR | Status: DC | PRN

## 2014-11-27 MED ORDER — ONDANSETRON HCL 4 MG/2ML IJ SOLN
INTRAMUSCULAR | Status: DC | PRN
  Administered 2014-11-27: 4 mg via INTRAVENOUS

## 2014-11-27 MED ORDER — FENTANYL CITRATE (PF) 250 MCG/5ML IJ SOLN
INTRAMUSCULAR | Status: AC
  Filled 2014-11-27: qty 5

## 2014-11-27 MED ORDER — NEOSTIGMINE METHYLSULFATE 10 MG/10ML IV SOLN
INTRAVENOUS | Status: AC
  Filled 2014-11-27: qty 1

## 2014-11-27 MED ORDER — DIPHENHYDRAMINE HCL 50 MG/ML IJ SOLN
12.5000 mg | Freq: Four times a day (QID) | INTRAMUSCULAR | Status: DC | PRN
  Administered 2014-11-27: 12.5 mg via INTRAVENOUS
  Filled 2014-11-27: qty 1

## 2014-11-27 MED ORDER — GLYCOPYRROLATE 0.2 MG/ML IJ SOLN
INTRAMUSCULAR | Status: DC | PRN
  Administered 2014-11-27: 0.4 mg via INTRAVENOUS

## 2014-11-27 MED ORDER — ONDANSETRON HCL 4 MG/2ML IJ SOLN: 4.0000 mg | Freq: Four times a day (QID) | INTRAMUSCULAR | Status: DC | PRN

## 2014-11-27 MED ORDER — PHENYLEPHRINE HCL 10 MG/ML IJ SOLN
INTRAMUSCULAR | Status: DC | PRN
  Administered 2014-11-27: 4 mg

## 2014-11-27 MED ORDER — MIDAZOLAM HCL 2 MG/2ML IJ SOLN
INTRAMUSCULAR | Status: AC
  Filled 2014-11-27: qty 2

## 2014-11-27 MED ORDER — LACTATED RINGERS IV SOLN
INTRAVENOUS | Status: DC
  Administered 2014-11-27 (×3): via INTRAVENOUS

## 2014-11-27 MED ORDER — FENTANYL CITRATE (PF) 100 MCG/2ML IJ SOLN
25.0000 ug | INTRAMUSCULAR | Status: DC | PRN
  Administered 2014-11-27 (×3): 50 ug via INTRAVENOUS

## 2014-11-27 MED ORDER — ROCURONIUM BROMIDE 100 MG/10ML IV SOLN
INTRAVENOUS | Status: AC
  Filled 2014-11-27: qty 1

## 2014-11-27 SURGICAL SUPPLY — 25 items
CANISTER SUCT 3000ML (MISCELLANEOUS) ×2 IMPLANT
CLOTH BEACON ORANGE TIMEOUT ST (SAFETY) ×2 IMPLANT
CONT PATH 16OZ SNAP LID 3702 (MISCELLANEOUS) IMPLANT
DECANTER SPIKE VIAL GLASS SM (MISCELLANEOUS) IMPLANT
DRAPE STERI URO 9X17 APER PCH (DRAPES) ×2 IMPLANT
DRSG TELFA 3X8 NADH (GAUZE/BANDAGES/DRESSINGS) ×2 IMPLANT
GLOVE BIO SURGEON STRL SZ7.5 (GLOVE) ×2 IMPLANT
GLOVE BIOGEL PI IND STRL 6.5 (GLOVE) ×1 IMPLANT
GLOVE BIOGEL PI INDICATOR 6.5 (GLOVE) ×1
GOWN STRL REUS W/TWL LRG LVL3 (GOWN DISPOSABLE) ×8 IMPLANT
NEEDLE HYPO 22GX1.5 SAFETY (NEEDLE) IMPLANT
NEEDLE MAYO .5 CIRCLE (NEEDLE) ×2 IMPLANT
NEEDLE SPNL 22GX3.5 QUINCKE BK (NEEDLE) IMPLANT
NS IRRIG 1000ML POUR BTL (IV SOLUTION) ×2 IMPLANT
PACK VAGINAL WOMENS (CUSTOM PROCEDURE TRAY) ×2 IMPLANT
PAD OB MATERNITY 4.3X12.25 (PERSONAL CARE ITEMS) ×2 IMPLANT
SUT VIC AB 0 CT1 18XCR BRD8 (SUTURE) ×4 IMPLANT
SUT VIC AB 0 CT1 27 (SUTURE) ×2
SUT VIC AB 0 CT1 27XBRD ANBCTR (SUTURE) ×2 IMPLANT
SUT VIC AB 0 CT1 8-18 (SUTURE) ×4
SUT VIC AB 1 CT1 36 (SUTURE) ×2 IMPLANT
SUT VICRYL 0 TIES 12 18 (SUTURE) ×2 IMPLANT
TOWEL OR 17X24 6PK STRL BLUE (TOWEL DISPOSABLE) ×4 IMPLANT
TRAY FOLEY CATH SILVER 14FR (SET/KITS/TRAYS/PACK) ×2 IMPLANT
WATER STERILE IRR 1000ML POUR (IV SOLUTION) ×2 IMPLANT

## 2014-11-27 NOTE — Op Note (Signed)
Kristi Hendrix, LUVIANO NO.:  [id number]  MEDICAL RECORD NO.:  [id number]  LOCATION:  [facility]                          FACILITY:  [facility]  PHYSICIAN:  Lucille Passy. Ulanda Edison, M.D. DATE OF BIRTH:  09-25-1965  DATE OF PROCEDURE:  11/27/2014 DATE OF DISCHARGE:                              OPERATIVE REPORT   PREOPERATIVE DIAGNOSIS:  Menorrhagia, dysmenorrhea, probable adenomyosis.  POSTOPERATIVE DIAGNOSIS:  Menorrhagia, dysmenorrhea, probable adenomyosis.  OPERATION:  Vaginal hysterectomy.  OPERATOR:  Lucille Passy. Ulanda Edison, M.D.  ASSISTANT:  Diona [place], MD.  ANESTHESIA:  General anesthesia.  DESCRIPTION OF PROCEDURE:  The patient was brought to the operating room and placed under satisfactory general anesthesia and placed in lithotomy position in the [facility].  Exam revealed the uterus to be anterior, normal size.  The adnexa were free of masses.  The cul-de-sac was not thickened.  The vulva, vagina, perineum, and urethra were prepped with Betadine solution, and a Foley catheter was inserted to straight drain.  A time-out was done and then the area was draped as a sterile field.  A weighted speculum was placed posteriorly.  The cervix was grasped with 2 Lahey clamps.  The cervicovaginal junction was injected with a dilute solution of Neo-Synephrine.  A circumferential incision was made around the cervix.  The anterior vaginal mucosa was pushed ahead.  I did not see the peritoneum, pushed the posterior vaginal mucosa posteriorly, and it took 3 attempts to enter the peritoneal cavity.  I then placed the banana retractor into the peritoneal cavity and the entry in the peritoneum was high enough that I could not get a bite in the peritoneum and include the parametrial tissue, so I began by clamping, cutting, and suture ligating 2 bites extraperitoneally, then the peritoneum came down.  I was able to clamp, cut, and suture ligate the peritoneum in with the next bite.  I  held these.  I then continued up the parametrial tissues with clamp, cut and suture ligate.  I then inverted the uterus through the incision in the cul-de-sac and clamped across the upper pedicles, took 3 bites on the right side, 2 bites on the left, I then suture ligated these and held the ones that I thought were the most vascular.  I doubly suture ligated the most vascular pedicles, suture ligated the others, seemed to be good hemostasis.  Both tubes and ovaries appeared normal.  I then pursestringed the peritoneum closing the peritoneal cavity with the pursestring suture once I would tie it down.  I left it untied.  I sutured the posterior vaginal cuff with a running suture of 0 Vicryl going from uterosacral ligament across the posterior pelvic peritoneum and down to the other uterosacral ligament.  I then tied this, searched for hemostasis, could find no significant bleeding except there was 1 spot on the left broad ligament that I did suture ligate to control any amount of bleeding that was present.  I then searched again for hemostasis, found no bleeding, closed off the peritoneal cavity by tying down the pursestring suture and closed the vaginal cuff with 3 interrupted sutures of 0 Vicryl.  I did tie the uterosacral ligaments together in the midline.  Blood loss for the procedure was thought to be 250 mL.  Sponge and needle counts were correct, and she was returned to recovery in satisfactory condition.     Lucille Passy. Ulanda Edison, M.D.     TFH/MEDQ  D:  11/27/2014  T:  11/27/2014  Job:  [id number]

## 2014-11-27 NOTE — Transfer of Care (Signed)
Immediate Anesthesia Transfer of Care Note  Patient: Kristi Hendrix  Procedure(s) Performed: Procedure(s): HYSTERECTOMY VAGINAL (N/A)  Patient Location: PACU  Anesthesia Type:General  Level of Consciousness: awake, alert  and oriented  Airway & Oxygen Therapy: Patient Spontanous Breathing and Patient connected to nasal cannula oxygen  Post-op Assessment: Report given to RN  Post vital signs: Reviewed  Last Vitals:  Filed Vitals:   11/27/14 0625  BP: 128/69  Pulse: 74  Temp: 36.3 C  Resp: 20    Complications: No apparent anesthesia complications

## 2014-11-27 NOTE — H&P (Signed)
NAMEAURORAH, Kristi Hendrix NO.:  [id number]  MEDICAL RECORD NO.:  [id number]  LOCATION:                                 FACILITY:  PHYSICIAN:  Lucille Passy. Ulanda Edison, M.D.      DATE OF BIRTH:  DATE OF ADMISSION:  11/27/2014 DATE OF DISCHARGE:                             HISTORY & PHYSICAL   HISTORY OF PRESENT ILLNESS:  This is a 49 year old white female, para 2- 0-0-2, who is admitted to the hospital for vaginal hysterectomy because of severe dysmenorrhea, severe menorrhagia, adenomyosis on ultrasound and unwillingness to try of the methods that would possibly help her condition.  In 2011, the patient was seen after an 11-year absence complaining of essentially the same thing she has now.  At that time, she said that she had periods every 28 days that lasted 10 days and she reported using 15 to 20 pads a day.  She rated her pain with her periods as 8/10.  She did not seem to have any dyspareunia.  She did not return for regular exams, was seen in March 2016 and at that time, she complained of having irregular periods that sometimes lasted 15 days and were very heavy.  She claimed that she would change a pant and a tampon every hour for 4 days, had small clots, took Excedrin for the cramping and wanted to have a hysterectomy.  I saw her on September 25, 2014.  Again, she confirmed the history wanting a hysterectomy for prolonged and heavy bleeding and dysmenorrhea.  She declined using an IUD.  In April, I performed an endometrial biopsy that suggested a polyp, but no malignancy and agreed to schedule her hysterectomy that is what she wanted.  She wanted that done, so she was scheduled for the hysterectomy.  PAST MEDICAL HISTORY:  The patient claims to have a blood clot in her leg in relationship to a fractured leg at age 45 while she was on birth control pills, but she points to the anterior aspect of her leg as the place the blood clot was.  I am unable to know anything more  about that.  PAST SURGICAL HISTORY:  She had a tubal ligation in 1987.  She had surgery for tendinitis in 2009.  ALLERGIES:  Aspirin, caused prolonged sleeping.  Lortab, caused severe rash.  SOCIAL HISTORY:  She never smoked.  She drinks occasionally.  Denies drugs.  Has a 12th grade education.  Works in Press photographer for [employer].  Husband is disabled from heart problems.  FAMILY HISTORY:  Maternal aunt had cancer of the breast.  Father, cardiac arrest, diabetes.  Paternal grandfather, cardiac arrest and diabetes.  Mother, tumor of the cervix.  Sister, epilepsy.  Son, leukemia.  PHYSICAL EXAMINATION:  VITAL SIGNS:  Her examination in our office on Nov 21, 2014, weight was 174 pounds, height 5 feet 1 inch.  Pulse was 80. HEAD, EYES, NOSE, AND THROAT:  Normal. NECK:  Supple without thyromegaly. LUNGS:  Clear to auscultation.  Breath sounds were heard on expiration, but no wheezing. HEART:  Normal size and sounds. BREASTS:  Soft without masses. ABDOMEN:  Short midline incision below the umbilicus from her tubal ligation.  PELVIC:  Revealed nothing of significance.  Cervix was clean.  The uterus was anterior and normal size.  Adnexa were free of masses.  An ultrasound done on September 12, 2014, showed myometrial echo pattern suggestive of adenomyosis, small ovarian cyst was echo free and another had irregular margins.  The pattern was suggestive of a ruptured hemorrhagic ovarian cyst.  ADMITTING IMPRESSION:  Menorrhagia, dysmenorrhea, adenomyosis.  The patient refuses to use nonsurgical methods to try to correct her problem and is admitted for vaginal hysterectomy.  She understands the risks of surgery, I will treat her with heparin with pulsatile, antiembolism stockings, early ambulation, and advised her to be aware that thromboembolic phenomenon could occur.  She is advised of other complications of surgery including injury to the urinary tract, injury to the bowel, postoperative  infection, hemorrhage with or after surgery, bowel problems, and other unanticipated problems.  She understands and agrees to proceed.     Lucille Passy. Ulanda Edison, M.D.     TFH/MEDQ  D:  11/24/2014  T:  11/24/2014  Job:  [id number]

## 2014-11-27 NOTE — Progress Notes (Signed)
Day of Surgery Procedure(s) (LRB): HYSTERECTOMY VAGINAL (N/A)  Subjective: Patient reports doing very well.  Mild cramping only.  Would like to come off PCA, is not hitting it anyway. Tolerating regulaar diet and ambulated short distance  Objective: I have reviewed patient's vital signs, intake and output and medications. Good UOP  General: alert and cooperative GI: soft NT  Assessment: s/p Procedure(s): HYSTERECTOMY VAGINAL (N/A): progressing well  Plan: Advance to PO medication per pt request   LOS: 0 days    Loral Campi W 11/27/2014, 5:25 PM

## 2014-11-27 NOTE — Anesthesia Postprocedure Evaluation (Signed)
  Anesthesia Post-op Note  Patient: Kristi Hendrix  Procedure(s) Performed: Procedure(s) (LRB): HYSTERECTOMY VAGINAL (N/A)  Patient Location: PACU  Anesthesia Type: General  Level of Consciousness: awake and alert   Airway and Oxygen Therapy: Patient Spontanous Breathing  Post-op Pain: mild  Post-op Assessment: Post-op Vital signs reviewed, Patient's Cardiovascular Status Stable, Respiratory Function Stable, Patent Airway and No signs of Nausea or vomiting  Last Vitals:  Filed Vitals:   11/27/14 0930  BP:   Pulse:   Temp: 36.4 C  Resp:     Post-op Vital Signs: stable   Complications: No apparent anesthesia complications

## 2014-11-27 NOTE — Anesthesia Procedure Notes (Signed)
Procedure Name: Intubation Date/Time: 11/27/2014 7:28 AM Performed by: Talbot Grumbling Pre-anesthesia Checklist: Patient identified, Emergency Drugs available, Suction available and Patient being monitored Patient Re-evaluated:Patient Re-evaluated prior to inductionOxygen Delivery Method: Circle system utilized Preoxygenation: Pre-oxygenation with 100% oxygen Intubation Type: IV induction Ventilation: Mask ventilation without difficulty Laryngoscope Size: Miller and 2 Grade View: Grade II Tube type: Oral Tube size: 7.0 mm Number of attempts: 1 Airway Equipment and Method: Stylet Placement Confirmation: ETT inserted through vocal cords under direct vision,  positive ETCO2 and breath sounds checked- equal and bilateral Secured at: 21 cm Tube secured with: Tape Dental Injury: Teeth and Oropharynx as per pre-operative assessment

## 2014-11-27 NOTE — Anesthesia Postprocedure Evaluation (Signed)
Anesthesia Post Note  Patient: Kristi Hendrix  Procedure(s) Performed: Procedure(s) (LRB): HYSTERECTOMY VAGINAL (N/A)  Anesthesia type: General  Patient location: Mother/Baby  Post pain: Pain level controlled  Post assessment: Post-op Vital signs reviewed  Last Vitals:  Filed Vitals:   11/27/14 1349  BP:   Pulse:   Temp:   Resp: 11    Post vital signs: Reviewed  Level of consciousness: awake and alert   Complications: No apparent anesthesia complications, patient still with nausea. Dr. Royce Macadamia contacted and ordering Reglan one time dose.

## 2014-11-27 NOTE — Progress Notes (Signed)
Patient ID: Kristi Hendrix, female   DOB: 07/14/1965, 49 y.o.   MRN: [id number] I examined this lady 11-21-14 and she reports no change in her health since that time

## 2014-11-27 NOTE — Addendum Note (Signed)
Addendum  created 11/27/14 1359 by Talbot Grumbling, CRNA   Modules edited: Notes Section, Orders   Notes Section:  File: [id number]

## 2014-11-28 ENCOUNTER — Encounter (HOSPITAL_COMMUNITY): Payer: Self-pay | Admitting: Obstetrics and Gynecology

## 2014-11-28 DIAGNOSIS — N920 Excessive and frequent menstruation with regular cycle: Secondary | ICD-10-CM | POA: Diagnosis not present

## 2014-11-28 LAB — CBC WITH DIFFERENTIAL/PLATELET
BASOS PCT: 0 % (ref 0–1)
Basophils Absolute: 0 10*3/uL (ref 0.0–0.1)
Eosinophils Absolute: 0.1 10*3/uL (ref 0.0–0.7)
Eosinophils Relative: 1 % (ref 0–5)
HEMATOCRIT: 32.5 % — AB (ref 36.0–46.0)
Hemoglobin: 10.8 g/dL — ABNORMAL LOW (ref 12.0–15.0)
Lymphocytes Relative: 30 % (ref 12–46)
Lymphs Abs: 3.2 10*3/uL (ref 0.7–4.0)
MCH: 29 pg (ref 26.0–34.0)
MCHC: 33.2 g/dL (ref 30.0–36.0)
MCV: 87.1 fL (ref 78.0–100.0)
MONOS PCT: 8 % (ref 3–12)
Monocytes Absolute: 0.9 10*3/uL (ref 0.1–1.0)
NEUTROS ABS: 6.5 10*3/uL (ref 1.7–7.7)
Neutrophils Relative %: 61 % (ref 43–77)
Platelets: 307 10*3/uL (ref 150–400)
RBC: 3.73 MIL/uL — ABNORMAL LOW (ref 3.87–5.11)
RDW: 14.2 % (ref 11.5–15.5)
WBC: 10.7 10*3/uL — ABNORMAL HIGH (ref 4.0–10.5)

## 2014-11-28 MED ORDER — HYDROMORPHONE HCL 2 MG PO TABS: 2.0000 mg | ORAL_TABLET | ORAL | Status: DC | PRN

## 2014-11-28 NOTE — Progress Notes (Signed)
Patient ID: Kristi Hendrix, female   DOB: 1966/01/17, 49 y.o.   MRN: [id number] #1 afebrile BP normal HGB 13.2 to 10.8 Good output no pain Abdomen soft and not tender. She has tolerated a diet and ambulated. She is ready for d/c

## 2014-11-28 NOTE — Discharge Instructions (Signed)
No vaginal entrance. Call with temp > 100.4 degrees, heavy vaginal bleeding or any problems

## 2014-11-28 NOTE — Progress Notes (Signed)
Teaching complete  Out in wheelchair   

## 2014-11-29 NOTE — Discharge Summary (Signed)
NAMECHRISTANA, Kristi Hendrix NO.:  [id number]  MEDICAL RECORD NO.:  [id number]  LOCATION:  9833                          FACILITY:  [facility]  PHYSICIAN:  Lucille Passy. Ulanda Edison, M.D. DATE OF BIRTH:  09/12/1965  DATE OF ADMISSION:  11/27/2014 DATE OF DISCHARGE:  11/28/2014                              DISCHARGE SUMMARY   HISTORY OF PRESENT ILLNESS:  This is a 49 year old white female, para 2- 0-2, admitted for vaginal hysterectomy because of severe dysmenorrhea and  menorrhagia and adenomyosis on ultrasound and no desire to try methods that possibly would help her condition.  She underwent a vaginal hysterectomy by Dr. Ulanda Edison and Dr. Evern Bio assisting under general anesthesia.  Did well postoperatively.  Vital signs remained stable and excellent urine output.  Abdomen remained soft and nontender.  She had no pain of significance and was ready for discharge on the first postop day.  Initial hemoglobin was 13.  Followup hemoglobin on the morning of discharge 10.8 and hematocrit 32.5.  FINAL DIAGNOSES:  Menorrhagia, dysmenorrhea, presumed adenomyosis, hypothyroidism, and asthma.  OPERATION:  Vaginal hysterectomy.  FINAL CONDITION:  Improved.  INSTRUCTIONS:  Include our regular discharge instructions to avoid vaginal entrance, no heavy lifting or strenuous activity.  Call with fever greater than 100.4 degrees.  Call with any unusual problems. Resume her thyroid replacement medication, her asthma inhaler as needed. She is to take Dilaudid 2 mg 30 tablets one every 4 hours as needed for pain and to return to the office in 2 weeks for followup examination.     Lucille Passy. Ulanda Edison, M.D.     TFH/MEDQ  D:  11/28/2014  T:  11/28/2014  Job:  [id number]

## 2014-12-14 ENCOUNTER — Other Ambulatory Visit: Payer: Self-pay | Admitting: Family

## 2015-01-18 ENCOUNTER — Other Ambulatory Visit: Payer: Self-pay | Admitting: Family

## 2015-02-21 ENCOUNTER — Other Ambulatory Visit: Payer: Self-pay | Admitting: Family

## 2015-03-13 ENCOUNTER — Ambulatory Visit (INDEPENDENT_AMBULATORY_CARE_PROVIDER_SITE_OTHER): Payer: 59 | Admitting: Family

## 2015-03-13 ENCOUNTER — Other Ambulatory Visit (INDEPENDENT_AMBULATORY_CARE_PROVIDER_SITE_OTHER): Payer: 59

## 2015-03-13 ENCOUNTER — Encounter: Payer: Self-pay | Admitting: Family

## 2015-03-13 VITALS — BP 132/86 | HR 77 | Temp 98.6°F | Resp 18 | Ht 61.5 in | Wt 175.0 lb

## 2015-03-13 DIAGNOSIS — R5383 Other fatigue: Secondary | ICD-10-CM

## 2015-03-13 DIAGNOSIS — Z0000 Encounter for general adult medical examination without abnormal findings: Secondary | ICD-10-CM

## 2015-03-13 DIAGNOSIS — E039 Hypothyroidism, unspecified: Secondary | ICD-10-CM

## 2015-03-13 LAB — CBC
HCT: 41.5 % (ref 36.0–46.0)
Hemoglobin: 13.8 g/dL (ref 12.0–15.0)
MCHC: 33.2 g/dL (ref 30.0–36.0)
MCV: 87 fl (ref 78.0–100.0)
Platelets: 401 10*3/uL — ABNORMAL HIGH (ref 150.0–400.0)
RBC: 4.78 Mil/uL (ref 3.87–5.11)
RDW: 14.6 % (ref 11.5–15.5)
WBC: 7.1 10*3/uL (ref 4.0–10.5)

## 2015-03-13 LAB — LIPID PANEL
CHOL/HDL RATIO: 3
Cholesterol: 191 mg/dL (ref 0–200)
HDL: 70.6 mg/dL (ref >39.00)
LDL Cholesterol: 102 mg/dL — ABNORMAL HIGH (ref 0–99)
NONHDL: 120.15
Triglycerides: 90 mg/dL (ref 0.0–149.0)
VLDL: 18 mg/dL (ref 0.0–40.0)

## 2015-03-13 LAB — TSH: TSH: 2.72 u[IU]/mL (ref 0.35–4.50)

## 2015-03-13 LAB — VITAMIN B12: Vitamin B-12: 551 pg/mL (ref 211–911)

## 2015-03-13 NOTE — Progress Notes (Signed)
Pre visit review using our clinic review tool, if applicable. No additional management support is needed unless otherwise documented below in the visit note. 

## 2015-03-13 NOTE — Patient Instructions (Signed)
Thank you for choosing [facility].  Summary/Instructions:  Your prescription(s) have been submitted to your pharmacy or been printed and provided for you. Please take as directed and contact our office if you believe you are having problem(s) with the medication(s) or have any questions.  Please stop by the lab on the basement level of the building for your blood work. Your results will be released to MyChart (or called to you) after review, usually within 72 hours after test completion. If any changes need to be made, you will be notified at that same time.  If your symptoms worsen or fail to improve, please contact our office for further instruction, or in case of emergency go directly to the emergency room at the closest medical facility.    Hypothyroidism The thyroid is a large gland located in the lower front of your neck. The thyroid gland helps control metabolism. Metabolism is how your body handles food. It controls metabolism with the hormone thyroxine. When this gland is underactive (hypothyroid), it produces too little hormone.  CAUSES These include:   Absence or destruction of thyroid tissue.  Goiter due to iodine deficiency.  Goiter due to medications.  Congenital defects (since birth).  Problems with the pituitary. This causes a lack of TSH (thyroid stimulating hormone). This hormone tells the thyroid to turn out more hormone. SYMPTOMS  Lethargy (feeling as though you have no energy)  Cold intolerance  Weight gain (in spite of normal food intake)  Dry skin  Coarse hair  Menstrual irregularity (if severe, may lead to infertility)  Slowing of thought processes Cardiac problems are also caused by insufficient amounts of thyroid hormone. Hypothyroidism in the newborn is cretinism, and is an extreme form. It is important that this form be treated adequately and immediately or it will lead rapidly to retarded physical and mental development. DIAGNOSIS  To  prove hypothyroidism, your caregiver may do blood tests and ultrasound tests. Sometimes the signs are hidden. It may be necessary for your caregiver to watch this illness with blood tests either before or after diagnosis and treatment. TREATMENT  Low levels of thyroid hormone are increased by using synthetic thyroid hormone. This is a safe, effective treatment. It usually takes about four weeks to gain the full effects of the medication. After you have the full effect of the medication, it will generally take another four weeks for problems to leave. Your caregiver may start you on low doses. If you have had heart problems the dose may be gradually increased. It is generally not an emergency to get rapidly to normal. HOME CARE INSTRUCTIONS   Take your medications as your caregiver suggests. Let your caregiver know of any medications you are taking or start taking. Your caregiver will help you with dosage schedules.  As your condition improves, your dosage needs may increase. It will be necessary to have continuing blood tests as suggested by your caregiver.  Report all suspected medication side effects to your caregiver. SEEK MEDICAL CARE IF: Seek medical care if you develop:  Sweating.  Tremulousness (tremors).  Anxiety.  Rapid weight loss.  Heat intolerance.  Emotional swings.  Diarrhea.  Weakness. SEEK IMMEDIATE MEDICAL CARE IF:  You develop chest pain, an irregular heart beat (palpitations), or a rapid heart beat. MAKE SURE YOU:   Understand these instructions.  Will watch your condition.  Will get help right away if you are not doing well or get worse. Document Released: 06/23/2005 Document Revised: 09/15/2011 Document Reviewed: 02/11/2008 Exit[employer] Patient   Information 2015 [employer]. This information is not intended to replace advice given to you by your health care provider. Make sure you discuss any questions you have with your health care provider.  

## 2015-03-13 NOTE — Assessment & Plan Note (Signed)
Continues to experience increased fatigue and changes to hair with current dosage of Synthroid. Obtain TSH. Changes to medication pending TSH results.

## 2015-03-13 NOTE — Progress Notes (Signed)
Subjective:    Patient ID: Kristi Hendrix, female    DOB: [birth year], 49 y.o.   MRN: [id number]  Chief Complaint  Patient presents with  . Follow-up    TSH check, does not feel like her medication is working anymore, fatigue and hair is falling out, wants cholesterol check    HPI:  Kristi Hendrix is a 49 y.o. female with a PMH of asthma, hypothyroidism, hysterectomy, and fatigue who presents today for office follow-up.    1.) Elevated TSH - Started on levothyroxine and currently taking 25 mcg. Takes the medication as prescribed and notes that she does have fatigue and has noticed that her hair has started falling out again. Has also been making dietary changes as she is working to lose weight.   Lab Results  Component Value Date   TSH 2.60 11/21/2014    2.) Cholesterol - Has not had her cholesterol checked recently and would like to know if that is a contributing factor to her fatigue. No previous results are available.    3.) Fatigue - Continues to experience overall fatigue that has been going on for several months. Indicates she sleeps well at night but does snore. Husband states he does not note any cessation in breathing. She is also working 13 hours per day.  Questions if fatigue is related to her hypothyroidism, hysterectomy or another cause. Modifying factors include B12 supplementation which she is unsure if it helps.    Allergies  Allergen Reactions  . Aspirin     Makes pt sleepy  . Codeine Itching    Current Outpatient Prescriptions on File Prior to Visit  Medication Sig Dispense Refill  . albuterol (PROVENTIL HFA;VENTOLIN HFA) 108 (90 BASE) MCG/ACT inhaler Inhale 1-2 puffs into the lungs every 6 (six) hours as needed. Shortness of breath 1 Inhaler 3  . HYDROmorphone (DILAUDID) 2 MG tablet Take 1 tablet (2 mg total) by mouth every 4 (four) hours as needed for severe pain. 30 tablet 0  . SYNTHROID 25 MCG tablet TAKE ONE TABLET BY MOUTH ONCE DAILY BEFORE  BREAKFAST 30 tablet 0   No current facility-administered medications on file prior to visit.    Past Medical History  Diagnosis Date  . Asthma   . Chicken pox   . Seasonal allergies   . UTI (lower urinary tract infection)   . SVD (spontaneous vaginal delivery)     x 2  . Hypothyroidism   . History of bronchitis   . Detached retina     left eye, will have surgery to repair after recovering from GYN surgery  . Headache     otc med prn      Review of Systems  Constitutional: Positive for fatigue. Negative for fever and chills.  Respiratory: Negative for chest tightness and shortness of breath.   Cardiovascular: Negative for chest pain, palpitations and leg swelling.  Neurological: Negative for weakness and headaches.      Objective:    BP 132/86 mmHg  Pulse 77  Temp(Src) 98.6 F (37 C) (Oral)  Resp 18  Ht 5' 1.5" (1.562 m)  Wt 175 lb (79.379 kg)  BMI 32.53 kg/m2  SpO2 98% Nursing note and vital signs reviewed.  Physical Exam  Constitutional: She is oriented to person, place, and time. She appears well-developed and well-nourished. No distress.  Neck: Neck supple. No thyromegaly present.  Cardiovascular: Normal rate, regular rhythm, normal heart sounds and intact distal pulses.   Pulmonary/Chest: Effort normal and breath sounds  normal.  Neurological: She is alert and oriented to person, place, and time.  Skin: Skin is warm and dry.  Psychiatric: She has a normal mood and affect. Her behavior is normal. Judgment and thought content normal.       Assessment & Plan:   Problem List Items Addressed This Visit      Endocrine   Hypothyroidism - Primary    Continues to experience increased fatigue and changes to hair with current dosage of Synthroid. Obtain TSH. Changes to medication pending TSH results.       Relevant Orders   TSH     Other   Laboratory exam ordered as part of routine general medical examination   Relevant Orders   Lipid Profile   Fatigue     Fatigue of multifactorial purpose but cannot rule out hypothyroidism or sleep disturbance. Obtain B12 and CBC to rule out other metabolic causes. Cannot rule out cardiovascular disease, anxiety or depression. Follow up pending lab results.      Relevant Orders   TSH   Lipid Profile   CBC   B12

## 2015-03-13 NOTE — Assessment & Plan Note (Signed)
Fatigue of multifactorial purpose but cannot rule out hypothyroidism or sleep disturbance. Obtain B12 and CBC to rule out other metabolic causes. Cannot rule out cardiovascular disease, anxiety or depression. Follow up pending lab results.

## 2015-03-14 ENCOUNTER — Telehealth: Payer: Self-pay | Admitting: Family

## 2015-03-14 DIAGNOSIS — E039 Hypothyroidism, unspecified: Secondary | ICD-10-CM

## 2015-03-14 MED ORDER — SYNTHROID 50 MCG PO TABS: 50.0000 ug | ORAL_TABLET | Freq: Every day | ORAL | Status: DC

## 2015-03-14 NOTE — Telephone Encounter (Signed)
Please inform patient that her blood work shows that her cholesterol, B12 and white/red blood cells are within the normal limits. Her TSH is 2.72 and since she has continued symptoms we will start a new dose of levothyroxine which has been sent to her pharmacy. Please have her follow up in 6 weeks to recheck her TSH.

## 2015-03-15 NOTE — Telephone Encounter (Signed)
Pt aware of results 

## 2015-03-16 NOTE — Telephone Encounter (Signed)
Error

## 2015-03-24 ENCOUNTER — Ambulatory Visit (INDEPENDENT_AMBULATORY_CARE_PROVIDER_SITE_OTHER): Payer: 59 | Admitting: Family Medicine

## 2015-03-24 ENCOUNTER — Encounter: Payer: Self-pay | Admitting: Family Medicine

## 2015-03-24 VITALS — BP 110/70 | HR 64 | Temp 97.8°F | Resp 18 | Ht 61.5 in | Wt 174.2 lb

## 2015-03-24 DIAGNOSIS — J302 Other seasonal allergic rhinitis: Secondary | ICD-10-CM | POA: Diagnosis not present

## 2015-03-24 MED ORDER — MONTELUKAST SODIUM 10 MG PO TABS: 10.0000 mg | ORAL_TABLET | Freq: Every day | ORAL | Status: DC

## 2015-03-24 MED ORDER — BENZONATATE 200 MG PO CAPS: 200.0000 mg | ORAL_CAPSULE | Freq: Three times a day (TID) | ORAL | Status: DC | PRN

## 2015-03-24 MED ORDER — ALBUTEROL SULFATE (2.5 MG/3ML) 0.083% IN NEBU: INHALATION_SOLUTION | RESPIRATORY_TRACT | Status: DC

## 2015-03-24 NOTE — Assessment & Plan Note (Signed)
Patient hasn't seasonal allergies previously. Patient has had this multiple times and also annually. Patient has no signs of it is in her lungs at this time. I do feel that Singulair could be beneficial for this individual. Patient given a trial of nasal steroid as well as could be beneficial during this year. Hopefully he'll going be short-term. Patient knows any fevers or chills she will seek medical attention for further evaluation. No imaging necessary at this time. Follow-up with primary care in the next week if not improved.

## 2015-03-24 NOTE — Patient Instructions (Signed)
Good to see you.  2 trials I gave you Dulera inhaled 1 times daily for next 10 days (will decrease inflammation in lungs Qnasl 1 spray each nostril daily for at least next 2 weeks Prescriptions Singulair nightly for allergies (insurance may give [place] trouble) Tessalon perles for the cough Refilled nebulizer medicine  See pcp in 1 week if not better.

## 2015-03-24 NOTE — Progress Notes (Signed)
SUBJECTIVE:  Kristi Hendrix is a 49 y.o. female who complains of congestion, sneezing, sore throat, swollen glands, nasal blockage, post nasal drip, productive cough and headache for 7 days. She denies a history of anorexia, chest pain, dizziness, fevers, myalgias, shortness of breath, sweats, vomiting, weakness and weight loss and has a history of asthma. Patient admits to smoke cigarettes. Past Medical History  Diagnosis Date  . Asthma   . Chicken pox   . Seasonal allergies   . UTI (lower urinary tract infection)   . SVD (spontaneous vaginal delivery)     x 2  . Hypothyroidism   . History of bronchitis   . Detached retina     left eye, will have surgery to repair after recovering from GYN surgery  . Headache     otc med prn   Past Surgical History  Procedure Laterality Date  . Elbow surgery      right  . Tubal ligation    . Vaginal hysterectomy N/A 11/27/2014    Procedure: HYSTERECTOMY VAGINAL;  Surgeon: Newton Pigg, MD;  Location: [facility];  Service: Gynecology;  Laterality: N/A;   Social History  Substance Use Topics  . Smoking status: Never Smoker   . Smokeless tobacco: Never Used  . Alcohol Use: Yes     Comment: Social drinker   Allergies  Allergen Reactions  . Aspirin     Makes pt sleepy  . Codeine Itching   Family History  Problem Relation Age of Onset  . Alcohol abuse Mother   . Alcohol abuse Father   . Diabetes Father   . Heart disease Maternal Grandfather       OBJECTIVE: Blood pressure 110/70, pulse 64, temperature 97.8 F (36.6 C), temperature source Oral, resp. rate 18, height 5' 1.5" (1.562 m), weight 174 lb 4 oz (79.039 kg), SpO2 97 %.  She appears well, vital signs are as noted. Ears normal tympanic bulging.  Throat and pharynx erythema of posterior pharynx. .  Neck supple. No adenopathy in the neck. Nose is congested. Sinuses non tender. The chest is clear, without wheezes or rales.No rash  ASSESSMENT:  nasopharyngitis and allergic  rhinitis  PLAN: Symptomatic therapy suggested: push fluids, rest and return office visit prn if symptoms persist or worsen. Lack of antibiotic effectiveness discussed with her. Call or return to clinic prn if these symptoms worsen or fail to improve as anticipated.

## 2015-03-26 ENCOUNTER — Telehealth: Payer: Self-pay | Admitting: Family

## 2015-03-26 NOTE — Telephone Encounter (Signed)
Pt needs an ins referral for To Dr Tempie Hoist.  She has an appt on the 9/26 at 8:30

## 2015-03-29 NOTE — Telephone Encounter (Signed)
[facility] auth # [id number] valid 03/28/16-09/26/15 for 6 visits.

## 2015-04-02 ENCOUNTER — Encounter (INDEPENDENT_AMBULATORY_CARE_PROVIDER_SITE_OTHER): Payer: 59 | Admitting: Ophthalmology

## 2015-04-02 DIAGNOSIS — H43813 Vitreous degeneration, bilateral: Secondary | ICD-10-CM | POA: Diagnosis not present

## 2015-04-27 ENCOUNTER — Other Ambulatory Visit: Payer: 59

## 2015-04-27 ENCOUNTER — Ambulatory Visit: Payer: 59 | Admitting: Internal Medicine

## 2015-04-28 ENCOUNTER — Encounter: Payer: Self-pay | Admitting: Family Medicine

## 2015-04-28 ENCOUNTER — Ambulatory Visit (INDEPENDENT_AMBULATORY_CARE_PROVIDER_SITE_OTHER): Payer: 59 | Admitting: Family Medicine

## 2015-04-28 VITALS — BP 140/90 | HR 75 | Temp 98.1°F | Resp 20 | Ht 61.5 in | Wt 174.0 lb

## 2015-04-28 DIAGNOSIS — R300 Dysuria: Secondary | ICD-10-CM

## 2015-04-28 LAB — POCT URINALYSIS DIPSTICK
BILIRUBIN UA: NEGATIVE
Blood, UA: NEGATIVE
Glucose, UA: NEGATIVE
KETONES UA: NEGATIVE
LEUKOCYTES UA: NEGATIVE
Nitrite, UA: NEGATIVE
Protein, UA: NEGATIVE
Spec Grav, UA: >=1.03
Urobilinogen, UA: 0.2
pH, UA: 5.5

## 2015-04-28 MED ORDER — ONDANSETRON HCL 4 MG PO TABS: 4.0000 mg | ORAL_TABLET | Freq: Three times a day (TID) | ORAL | Status: DC | PRN

## 2015-04-28 MED ORDER — CIPROFLOXACIN HCL 500 MG PO TABS: 500.0000 mg | ORAL_TABLET | Freq: Two times a day (BID) | ORAL | Status: DC

## 2015-04-28 NOTE — Patient Instructions (Signed)
Take the antibiotic as prescribed while waiting on culture.  If you worsen please let me know as I'm on-call this weekend.  Follow up closely with your primary.  Take care  Dr. Lacinda Axon

## 2015-04-28 NOTE — Progress Notes (Signed)
Pre visit review using our clinic review tool, if applicable. No additional management support is needed unless otherwise documented below in the visit note. 

## 2015-04-28 NOTE — Assessment & Plan Note (Signed)
Patient with characteristic symptoms of UTI. Urinalysis, however, was negative. Given symptomatology and duration of illness I'm treating her empirically with cipro while awaiting culture results.

## 2015-04-28 NOTE — Progress Notes (Signed)
Subjective:  Patient ID: Kristi Hendrix, female    DOB: 11/14/1965  Age: 49 y.o. MRN: [id number]  CC: ? UTI  HPI:  49 year old female presents to clinic today with complaints of right low back pain, dysuria, urinary urgency and frequency.  Patient reports that she has had the above symptoms for the last 4 days. She reports associated chills but has not taken her temperature and therefore unsure if she has any fever. She has had associated nausea. Right low back pain is moderate in severity. She's taken a muscle relaxant as well as Azo with little improvement in her symptoms. No known exacerbating factors.  Social Hx   Social History   Social History  . Marital Status: Married    Spouse Name: N/A  . Number of Children: 2  . Years of Education: 12   Occupational History  . Driver    Social History Main Topics  . Smoking status: Never Smoker   . Smokeless tobacco: Never Used  . Alcohol Use: Yes     Comment: Social drinker  . Drug Use: No  . Sexual Activity: Yes    Birth Control/ Protection: Surgical   Other Topics Concern  . None   Social History Narrative   Currently resides with husband. Fun: Hike, camp, fish and work in her yard.   Denies religious beliefs effecting healthcare.    Review of Systems  Constitutional: Positive for chills. Negative for fever.  Genitourinary: Positive for dysuria, urgency and frequency.  Musculoskeletal: Positive for back pain.   Objective:  BP 140/90 mmHg  Pulse 75  Temp(Src) 98.1 F (36.7 C) (Oral)  Resp 20  Ht 5' 1.5" (1.562 m)  Wt 174 lb (78.926 kg)  BMI 32.35 kg/m2  SpO2 98%  BP/Weight 04/28/2015 1/74/9449 12/12/5914  Systolic BP 384 665 993  Diastolic BP 90 70 86  Wt. (Lbs) 174 174.25 175  BMI 32.35 32.4 32.53   Physical Exam  Constitutional: She appears well-developed. No distress.  Patient appears fatigued.  Cardiovascular: Normal rate and regular rhythm.   No murmur heard. Pulmonary/Chest: Effort normal and  breath sounds normal. She has no wheezes. She has no rales.  Abdominal: Soft. She exhibits no distension.  No discrete areas of tenderness based on exam but patient reports that her entire abdomen is tender with palpation.  Neurological: She is alert.  Vitals reviewed.  Results for orders placed or performed in visit on 04/28/15 (from the past 24 hour(s))  POCT urinalysis dipstick     Status: None   Collection Time: 04/28/15  9:13 AM  Result Value Ref Range   Color, UA yellow    Clarity, UA clear    Glucose, UA negative    Bilirubin, UA negtive    Ketones, UA negative    Spec Grav, UA >=1.030    Blood, UA negative    pH, UA 5.5    Protein, UA negtive    Urobilinogen, UA 0.2    Nitrite, UA negative    Leukocytes, UA Negative Negative    Assessment & Plan:   Problem List Items Addressed This Visit    Dysuria - Primary    Patient with characteristic symptoms of UTI. Urinalysis, however, was negative. Given symptomatology and duration of illness I'm treating her empirically with cipro while awaiting culture results.       Relevant Orders   POCT urinalysis dipstick (Completed)   Urine culture      Meds ordered this encounter  Medications  .  ciprofloxacin (CIPRO) 500 MG tablet    Sig: Take 1 tablet (500 mg total) by mouth 2 (two) times daily.    Dispense:  14 tablet    Refill:  0  . ondansetron (ZOFRAN) 4 MG tablet    Sig: Take 1 tablet (4 mg total) by mouth every 8 (eight) hours as needed for nausea or vomiting.    Dispense:  20 tablet    Refill:  0   Follow-up: PRN  Thersa Salt, DO

## 2015-05-02 ENCOUNTER — Telehealth: Payer: Self-pay | Admitting: Family

## 2015-05-02 ENCOUNTER — Other Ambulatory Visit: Payer: 59

## 2015-05-02 ENCOUNTER — Ambulatory Visit (INDEPENDENT_AMBULATORY_CARE_PROVIDER_SITE_OTHER)
Admission: RE | Admit: 2015-05-02 | Discharge: 2015-05-02 | Disposition: A | Discharge status: Home / Self Care | Payer: 59 | Source: Ambulatory Visit | Attending: Family | Admitting: Family

## 2015-05-02 ENCOUNTER — Encounter: Payer: Self-pay | Admitting: Family

## 2015-05-02 ENCOUNTER — Ambulatory Visit (INDEPENDENT_AMBULATORY_CARE_PROVIDER_SITE_OTHER): Payer: 59 | Admitting: Family

## 2015-05-02 VITALS — BP 110/86 | HR 84 | Temp 98.7°F | Resp 18 | Ht 61.5 in | Wt 174.8 lb

## 2015-05-02 DIAGNOSIS — R109 Unspecified abdominal pain: Secondary | ICD-10-CM | POA: Insufficient documentation

## 2015-05-02 DIAGNOSIS — R300 Dysuria: Secondary | ICD-10-CM

## 2015-05-02 LAB — POCT URINALYSIS DIPSTICK
Bilirubin, UA: NEGATIVE
Glucose, UA: NEGATIVE
Ketones, UA: NEGATIVE
Leukocytes, UA: NEGATIVE
Nitrite, UA: NEGATIVE
PH UA: 6
PROTEIN UA: NEGATIVE
RBC UA: NEGATIVE
UROBILINOGEN UA: NEGATIVE

## 2015-05-02 NOTE — Progress Notes (Signed)
Pre visit review using our clinic review tool, if applicable. No additional management support is needed unless otherwise documented below in the visit note. 

## 2015-05-02 NOTE — Patient Instructions (Addendum)
Thank you for choosing [facility].  Summary/Instructions:  If your symptoms worsen or fail to improve, please contact our office for further instruction, or in case of emergency go directly to the emergency room at the closest medical facility.   Kidney Stones Kidney stones (urolithiasis) are deposits that form inside your kidneys. The intense pain is caused by the stone moving through the urinary tract. When the stone moves, the ureter goes into spasm around the stone. The stone is usually passed in the urine.  CAUSES   A disorder that makes certain neck glands produce too much parathyroid hormone (primary hyperparathyroidism).  A buildup of uric acid crystals, similar to gout in your joints.  Narrowing (stricture) of the ureter.  A kidney obstruction present at birth (congenital obstruction).  Previous surgery on the kidney or ureters.  Numerous kidney infections. SYMPTOMS   Feeling sick to your stomach (nauseous).  Throwing up (vomiting).  Blood in the urine (hematuria).  Pain that usually spreads (radiates) to the groin.  Frequency or urgency of urination. DIAGNOSIS   Taking a history and physical exam.  Blood or urine tests.  CT scan.  Occasionally, an examination of the inside of the urinary bladder (cystoscopy) is performed. TREATMENT   Observation.  Increasing your fluid intake.  Extracorporeal shock wave lithotripsy--This is a noninvasive procedure that uses shock waves to break up kidney stones.  Surgery may be needed if you have severe pain or persistent obstruction. There are various surgical procedures. Most of the procedures are performed with the use of small instruments. Only small incisions are needed to accommodate these instruments, so recovery time is minimized. The size, location, and chemical composition are all important variables that will determine the proper choice of action for you. Talk to your health care provider to better  understand your situation so that you will minimize the risk of injury to yourself and your kidney.  HOME CARE INSTRUCTIONS   Drink enough water and fluids to keep your urine clear or pale yellow. This will help you to pass the stone or stone fragments.  Strain all urine through the provided strainer. Keep all particulate matter and stones for your health care provider to see. The stone causing the pain may be as small as a grain of salt. It is very important to use the strainer each and every time you pass your urine. The collection of your stone will allow your health care provider to analyze it and verify that a stone has actually passed. The stone analysis will often identify what you can do to reduce the incidence of recurrences.  Only take over-the-counter or prescription medicines for pain, discomfort, or fever as directed by your health care provider.  Keep all follow-up visits as told by your health care provider. This is important.  Get follow-up X-rays if required. The absence of pain does not always mean that the stone has passed. It may have only stopped moving. If the urine remains completely obstructed, it can cause loss of kidney function or even complete destruction of the kidney. It is your responsibility to make sure X-rays and follow-ups are completed. Ultrasounds of the kidney can show blockages and the status of the kidney. Ultrasounds are not associated with any radiation and can be performed easily in a matter of minutes.  Make changes to your daily diet as told by your health care provider. You may be told to:  Limit the amount of salt that you eat.  Eat 5 or more  servings of fruits and vegetables each day.  Limit the amount of meat, poultry, fish, and eggs that you eat.  Collect a 24-hour urine sample as told by your health care provider.You may need to collect another urine sample every 6-12 months. SEEK MEDICAL CARE IF:  You experience pain that is progressive and  unresponsive to any pain medicine you have been prescribed. SEEK IMMEDIATE MEDICAL CARE IF:   Pain cannot be controlled with the prescribed medicine.  You have a fever or shaking chills.  The severity or intensity of pain increases over 18 hours and is not relieved by pain medicine.  You develop a new onset of abdominal pain.  You feel faint or pass out.  You are unable to urinate.   This information is not intended to replace advice given to you by your health care provider. Make sure you discuss any questions you have with your health care provider.   Document Released: 06/23/2005 Document Revised: 03/14/2015 Document Reviewed: 11/24/2012 Elsevier Interactive Patient Education [employer].

## 2015-05-02 NOTE — Progress Notes (Signed)
Subjective:    Patient ID: Kristi Hendrix, female    DOB: 1966-03-31, 49 y.o.   MRN: [id number]  Chief Complaint  Patient presents with  . Back Pain    having pain in the right side of her back, constant urinatiion    HPI:  Kristi Hendrix is a 49 y.o. female who  has a past medical history of Asthma; Chicken pox; Seasonal allergies; UTI (lower urinary tract infection); SVD (spontaneous vaginal delivery); Hypothyroidism; History of bronchitis; Detached retina; and Headache. and presents today for an acute office visit.   Associated symptom of flank pain and frequent urination have been going on for about for about 2 weeks. Recently seen in the office and diagnosed with dysuria and prescribed Cipro and ondansetron. She did have an allergic reaction to the ondansetron. Her urinalysis at the time showed no evidence of infection. Notes that it eased up for the first couple of days of the Cipro and has now returned.   Allergies  Allergen Reactions  . Aspirin     Makes pt sleepy  . Codeine Itching  . Zofran [Ondansetron Hcl] Hives     Current Outpatient Prescriptions on File Prior to Visit  Medication Sig Dispense Refill  . albuterol (PROVENTIL HFA;VENTOLIN HFA) 108 (90 BASE) MCG/ACT inhaler Inhale 1-2 puffs into the lungs every 6 (six) hours as needed. Shortness of breath 1 Inhaler 3  . albuterol (PROVENTIL) (2.5 MG/3ML) 0.083% nebulizer solution 1 vial every 4-6 hours as needed 30 vial 6  . benzonatate (TESSALON) 200 MG capsule Take 1 capsule (200 mg total) by mouth 3 (three) times daily as needed for cough. 20 capsule 0  . ciprofloxacin (CIPRO) 500 MG tablet Take 1 tablet (500 mg total) by mouth 2 (two) times daily. 14 tablet 0  . montelukast (SINGULAIR) 10 MG tablet Take 1 tablet (10 mg total) by mouth at bedtime. 90 tablet 3  . naproxen sodium (ALEVE) 220 MG tablet Take 220 mg by mouth 2 (two) times daily with a meal.    . SYNTHROID 50 MCG tablet Take 1 tablet (50 mcg total) by  mouth daily before breakfast. 30 tablet 1   No current facility-administered medications on file prior to visit.     Past Surgical History  Procedure Laterality Date  . Elbow surgery      right  . Tubal ligation    . Vaginal hysterectomy N/A 11/27/2014    Procedure: HYSTERECTOMY VAGINAL;  Surgeon: Newton Pigg, MD;  Location: [facility];  Service: Gynecology;  Laterality: N/A;    Review of Systems  Constitutional: Positive for chills. Negative for fever.  Genitourinary: Positive for dysuria, urgency, frequency and flank pain. Negative for hematuria.      Objective:    BP 110/86 mmHg  Pulse 84  Temp(Src) 98.7 F (37.1 C) (Oral)  Resp 18  Ht 5' 1.5" (1.562 m)  Wt 174 lb 12.8 oz (79.289 kg)  BMI 32.50 kg/m2  SpO2 97% Nursing note and vital signs reviewed.  Physical Exam  Constitutional: She is oriented to person, place, and time. She appears well-developed and well-nourished. No distress.  Cardiovascular: Normal rate, regular rhythm, normal heart sounds and intact distal pulses.   Pulmonary/Chest: Effort normal and breath sounds normal.  Abdominal: Soft. Normal appearance and bowel sounds are normal. There is tenderness in the right lower quadrant. There is CVA tenderness. There is no rigidity and negative Murphy's sign.  Neurological: She is alert and oriented to person, place, and time.  Skin: Skin is warm and dry.  Psychiatric: She has a normal mood and affect. Her behavior is normal. Judgment and thought content normal.       Assessment & Plan:   Problem List Items Addressed This Visit      Other   Dysuria    In office UA is normal with no evidence of infection. Symptoms and exam consistent with potential kidney stone. Obtain STAT renal CT. Follow up and further treatment pending results.   ADDENDUM: CT shows no evidence of kidney stone with potential for ilieus. This does not provide a rationale for her urinary symptoms. Patient was referred to the ED for further  evaluation and treatment.       Flank pain - Primary   Relevant Orders   CT RENAL STONE STUDY (Completed)   POCT urinalysis dipstick (Completed)   Urine culture

## 2015-05-02 NOTE — Assessment & Plan Note (Addendum)
In office UA is normal with no evidence of infection. Symptoms and exam consistent with potential kidney stone. Obtain STAT renal CT. Follow up and further treatment pending results.   ADDENDUM: CT shows no evidence of kidney stone with potential for ilieus. This does not provide a rationale for her urinary symptoms. Patient was referred to the ED for further evaluation and treatment.

## 2015-05-02 NOTE — Telephone Encounter (Signed)
Spoke with patient regarding CT results and no kidney stone and possible ilieus. She indicates that she has been constipated recently. Recommended she seek further evaluation in the ED for the potential ilieus. She indicates that she will comply with the recommendations.

## 2015-05-03 ENCOUNTER — Emergency Department (HOSPITAL_COMMUNITY)
Admission: EM | Admit: 2015-05-03 | Discharge: 2015-05-03 | Disposition: A | Discharge status: Home / Self Care | Payer: 59 | Attending: Emergency Medicine | Admitting: Emergency Medicine

## 2015-05-03 ENCOUNTER — Encounter (HOSPITAL_COMMUNITY): Payer: Self-pay | Admitting: *Deleted

## 2015-05-03 DIAGNOSIS — R109 Unspecified abdominal pain: Secondary | ICD-10-CM

## 2015-05-03 DIAGNOSIS — Z791 Long term (current) use of non-steroidal anti-inflammatories (NSAID): Secondary | ICD-10-CM | POA: Diagnosis not present

## 2015-05-03 DIAGNOSIS — Z8669 Personal history of other diseases of the nervous system and sense organs: Secondary | ICD-10-CM | POA: Diagnosis not present

## 2015-05-03 DIAGNOSIS — M545 Low back pain: Secondary | ICD-10-CM | POA: Diagnosis not present

## 2015-05-03 DIAGNOSIS — Z79899 Other long term (current) drug therapy: Secondary | ICD-10-CM | POA: Diagnosis not present

## 2015-05-03 DIAGNOSIS — Z8619 Personal history of other infectious and parasitic diseases: Secondary | ICD-10-CM | POA: Insufficient documentation

## 2015-05-03 DIAGNOSIS — Z87440 Personal history of urinary (tract) infections: Secondary | ICD-10-CM | POA: Diagnosis not present

## 2015-05-03 DIAGNOSIS — E039 Hypothyroidism, unspecified: Secondary | ICD-10-CM | POA: Diagnosis not present

## 2015-05-03 DIAGNOSIS — J45909 Unspecified asthma, uncomplicated: Secondary | ICD-10-CM | POA: Diagnosis not present

## 2015-05-03 LAB — COMPREHENSIVE METABOLIC PANEL
ALT: 17 U/L (ref 14–54)
AST: 20 U/L (ref 15–41)
Albumin: 3.7 g/dL (ref 3.5–5.0)
Alkaline Phosphatase: 83 U/L (ref 38–126)
Anion gap: 9 (ref 5–15)
BUN: 7 mg/dL (ref 6–20)
CHLORIDE: 107 mmol/L (ref 101–111)
CO2: 25 mmol/L (ref 22–32)
Calcium: 9.5 mg/dL (ref 8.9–10.3)
Creatinine, Ser: 0.98 mg/dL (ref 0.44–1.00)
Glucose, Bld: 90 mg/dL (ref 65–99)
POTASSIUM: 4.6 mmol/L (ref 3.5–5.1)
Sodium: 141 mmol/L (ref 135–145)
Total Bilirubin: 0.6 mg/dL (ref 0.3–1.2)
Total Protein: 7.1 g/dL (ref 6.5–8.1)

## 2015-05-03 LAB — CBC WITH DIFFERENTIAL/PLATELET
BASOS ABS: 0 10*3/uL (ref 0.0–0.1)
Basophils Relative: 0 %
EOS ABS: 0.2 10*3/uL (ref 0.0–0.7)
Eosinophils Relative: 3 %
HCT: 43.6 % (ref 36.0–46.0)
HEMOGLOBIN: 14.6 g/dL (ref 12.0–15.0)
LYMPHS ABS: 2.2 10*3/uL (ref 0.7–4.0)
LYMPHS PCT: 32 %
MCH: 29.4 pg (ref 26.0–34.0)
MCHC: 33.5 g/dL (ref 30.0–36.0)
MCV: 87.9 fL (ref 78.0–100.0)
Monocytes Absolute: 0.6 10*3/uL (ref 0.1–1.0)
Monocytes Relative: 8 %
NEUTROS PCT: 57 %
Neutro Abs: 4 10*3/uL (ref 1.7–7.7)
Platelets: 359 10*3/uL (ref 150–400)
RBC: 4.96 MIL/uL (ref 3.87–5.11)
RDW: 14.1 % (ref 11.5–15.5)
WBC: 7 10*3/uL (ref 4.0–10.5)

## 2015-05-03 LAB — URINE CULTURE
Colony Count: NO GROWTH
ORGANISM ID, BACTERIA: NO GROWTH

## 2015-05-03 LAB — URINALYSIS, ROUTINE W REFLEX MICROSCOPIC
Bilirubin Urine: NEGATIVE
Glucose, UA: NEGATIVE mg/dL
Hgb urine dipstick: NEGATIVE
Ketones, ur: NEGATIVE mg/dL
LEUKOCYTES UA: NEGATIVE
NITRITE: NEGATIVE
PROTEIN: NEGATIVE mg/dL
Specific Gravity, Urine: 1.009 (ref 1.005–1.030)
Urobilinogen, UA: 0.2 mg/dL (ref 0.0–1.0)
pH: 6 (ref 5.0–8.0)

## 2015-05-03 LAB — LIPASE, BLOOD: Lipase: 28 U/L (ref 11–51)

## 2015-05-03 MED ORDER — KETOROLAC TROMETHAMINE 15 MG/ML IJ SOLN
15.0000 mg | Freq: Once | INTRAMUSCULAR | Status: AC
  Administered 2015-05-03: 15 mg via INTRAVENOUS
  Filled 2015-05-03: qty 1

## 2015-05-03 MED ORDER — METOCLOPRAMIDE HCL 5 MG/ML IJ SOLN
10.0000 mg | Freq: Once | INTRAMUSCULAR | Status: AC
  Administered 2015-05-03: 10 mg via INTRAVENOUS
  Filled 2015-05-03: qty 2

## 2015-05-03 MED ORDER — SODIUM CHLORIDE 0.9 % IV BOLUS (SEPSIS)
1000.0000 mL | Freq: Once | INTRAVENOUS | Status: AC
  Administered 2015-05-03: 1000 mL via INTRAVENOUS

## 2015-05-03 MED ORDER — OMEPRAZOLE 20 MG PO CPDR: 20.0000 mg | DELAYED_RELEASE_CAPSULE | Freq: Every day | ORAL | Status: DC

## 2015-05-03 NOTE — ED Provider Notes (Signed)
CSN: [id number]     Arrival date & time 05/03/15  1035 History   First MD Initiated Contact with Patient 05/03/15 1041     No chief complaint on file.     The history is provided by the patient. No language interpreter was used.   Kristi Hendrix presents for CT scan abnormality.  She reports two weeks of right sided back pain described as "kidney infection pain". Pain is constant.  Pain is worse with lying flat, better with a heating pad.  She has associated nausea, constipation.  Last BM yesterday after the CT scan - normal. No fevers, does have frequent chills.  She was on abx last week for UTI, last took them yesterday.  She had an outpatient CT scan performed yesterday to rule out renal stone and it showed possible gastroparesis versus ileus.She was referred to the emergency department for further evaluation.  Past Medical History  Diagnosis Date  . Asthma   . Chicken pox   . Seasonal allergies   . UTI (lower urinary tract infection)   . SVD (spontaneous vaginal delivery)     x 2  . Hypothyroidism   . History of bronchitis   . Detached retina     left eye, will have surgery to repair after recovering from GYN surgery  . Headache     otc med prn   Past Surgical History  Procedure Laterality Date  . Elbow surgery      right  . Tubal ligation    . Vaginal hysterectomy N/A 11/27/2014    Procedure: HYSTERECTOMY VAGINAL;  Surgeon: Newton Pigg, MD;  Location: [facility];  Service: Gynecology;  Laterality: N/A;   Family History  Problem Relation Age of Onset  . Alcohol abuse Mother   . Alcohol abuse Father   . Diabetes Father   . Heart disease Maternal Grandfather    Social History  Substance Use Topics  . Smoking status: Never Smoker   . Smokeless tobacco: Never Used  . Alcohol Use: Yes     Comment: Social drinker   OB History    No data available     Review of Systems  All other systems reviewed and are negative.     Allergies  Aspirin; Codeine; and Zofran  Home  Medications   Prior to Admission medications   Medication Sig Start Date End Date Taking? Authorizing Provider  albuterol (PROVENTIL HFA;VENTOLIN HFA) 108 (90 BASE) MCG/ACT inhaler Inhale 1-2 puffs into the lungs every 6 (six) hours as needed. Shortness of breath 10/16/14  Yes Golden Circle, FNP  albuterol (PROVENTIL) (2.5 MG/3ML) 0.083% nebulizer solution 1 vial every 4-6 hours as needed 03/24/15  Yes Lyndal Pulley, DO  benzonatate (TESSALON) 200 MG capsule Take 1 capsule (200 mg total) by mouth 3 (three) times daily as needed for cough. 03/24/15  Yes Lyndal Pulley, DO  ciprofloxacin (CIPRO) 500 MG tablet Take 1 tablet (500 mg total) by mouth 2 (two) times daily. 04/28/15  Yes Coral Spikes, DO  cycloSPORINE (RESTASIS) 0.05 % ophthalmic emulsion Place 1 drop into both eyes 2 (two) times daily.   Yes Historical Provider, MD  montelukast (SINGULAIR) 10 MG tablet Take 1 tablet (10 mg total) by mouth at bedtime. 03/24/15  Yes Lyndal Pulley, DO  naproxen sodium (ALEVE) 220 MG tablet Take 220 mg by mouth 2 (two) times daily with a meal.   Yes Historical Provider, MD  SYNTHROID 50 MCG tablet Take 1 tablet (50 mcg total) by  mouth daily before breakfast. 03/14/15  Yes Golden Circle, FNP   BP 146/81 mmHg  Pulse 81  Temp(Src) 98.3 F (36.8 C) (Oral)  Resp 18  SpO2 99%  LMP 11/11/2014 (Approximate) Physical Exam  Constitutional: She is oriented to person, place, and time. She appears well-developed and well-nourished.  HENT:  Head: Normocephalic and atraumatic.  Cardiovascular: Normal rate and regular rhythm.   No murmur heard. Pulmonary/Chest: Effort normal and breath sounds normal. No respiratory distress.  Abdominal: Soft. There is no tenderness. There is no rebound and no guarding.  Musculoskeletal: She exhibits no edema or tenderness.  There is tenderness to palpation over the right lower lateral back.  Neurological: She is alert and oriented to person, place, and time.  5 out of 5  strength in all 4 extremities  Skin: Skin is warm and dry.  Psychiatric: She has a normal mood and affect. Her behavior is normal.  Nursing note and vitals reviewed.   ED Course  Procedures (including critical care time) Labs Review Labs Reviewed - No data to display  Imaging Review Ct Renal Stone Study  05/02/2015  CLINICAL DATA:  Right flank pain, nausea for 2 weeks EXAM: CT ABDOMEN AND PELVIS WITHOUT CONTRAST TECHNIQUE: Multidetector CT imaging of the abdomen and pelvis was performed following the standard protocol without IV contrast. COMPARISON:  None. FINDINGS: Lung bases are unremarkable. Sagittal images of the spine are unremarkable. There is gastric distension with gas and debris suspicious for ileus. No gastric outlet obstruction Unenhanced liver shows no biliary ductal dilatation. Gallbladder is contracted without calcified gallstones. No aortic aneurysm. The pancreas, spleen and adrenal glands are unremarkable. Unenhanced kidneys are symmetrical in size. No nephrolithiasis. No hydronephrosis or hydroureter. No calcified ureteral calculi. There is no pericecal inflammation. Normal appendix is noted in axial image 63. The terminal ileum is unremarkable. No small bowel obstruction. No ascites or free air. The uterus is surgically absent. No adnexal mass. The ovaries are unremarkable. No pelvic ascites or adenopathy. Pelvic phleboliths are noted. Bilateral distal ureter is unremarkable. Urinary bladder is empty limiting its assessment. IMPRESSION: 1. No nephrolithiasis.  No hydronephrosis or hydroureter. 2. No calcified ureteral calculi. 3. No pericecal inflammation.  Normal appendix. 4. Surgically absent uterus. 5. No small bowel obstruction. 6. Mild distension of the stomach with gas and debris suspicious for gastroparesis or ileus. Electronically Signed   By: Lahoma Crocker M.D.   On: 05/02/2015 16:48   I have personally reviewed and evaluated these images and lab results as part of my medical  decision-making.   EKG Interpretation None      MDM   Final diagnoses:  Right flank pain   Patient here for referral for elective evaluation following an abnormal CT scan.  Pt without clinical evidence of obstruction/ileus on exam or hx.  Pt with reproducible MSK low back pain. UA not c/w UTI.  Discussed with pt home care for low back pain/flank pain, outpatient follow up, return precautions.    Quintella Reichert, MD 05/04/15 1151

## 2015-05-03 NOTE — Discharge Instructions (Signed)
Flank Pain °Flank pain refers to pain that is located on the side of the body between the upper abdomen and the back. The pain may occur over a short period of time (acute) or may be long-term or reoccurring (chronic). It may be mild or severe. Flank pain can be caused by many things. °CAUSES  °Some of the more common causes of flank pain include: °· Muscle strains.   °· Muscle spasms.   °· A disease of your spine (vertebral disk disease).   °· A lung infection (pneumonia).   °· Fluid around your lungs (pulmonary edema).   °· A kidney infection.   °· Kidney stones.   °· A very painful skin rash caused by the chickenpox virus (shingles).   °· Gallbladder disease.   °HOME CARE INSTRUCTIONS  °Home care will depend on the cause of your pain. In general, °· Rest as directed by your caregiver. °· Drink enough fluids to keep your urine clear or pale yellow. °· Only take over-the-counter or prescription medicines as directed by your caregiver. Some medicines may help relieve the pain. °· Tell your caregiver about any changes in your pain. °· Follow up with your caregiver as directed. °SEEK IMMEDIATE MEDICAL CARE IF:  °· Your pain is not controlled with medicine.   °· You have new or worsening symptoms. °· Your pain increases.   °· You have abdominal pain.   °· You have shortness of breath.   °· You have persistent nausea or vomiting.   °· You have swelling in your abdomen.   °· You feel faint or pass out.   °· You have blood in your urine. °· You have a fever or persistent symptoms for more than 2-3 days. °· You have a fever and your symptoms suddenly get worse. °MAKE SURE YOU:  °· Understand these instructions. °· Will watch your condition. °· Will get help right away if you are not doing well or get worse. °  °This information is not intended to replace advice given to you by your health care provider. Make sure you discuss any questions you have with your health care provider. °  °Document Released: 08/14/2005 Document  Revised: 03/17/2012 Document Reviewed: 02/05/2012 °Elsevier Interactive Patient Education ©2016 Elsevier Inc[employer] ° °

## 2015-05-03 NOTE — ED Notes (Signed)
Pt presents via POV-sent by MD for SBO, pt had CT yesterday.  Pt c/o lower back pain, nausea, also loose stool x1 yesterday.  Reports having to take laxatives normally to have BP. Pt a x 4, NAD.

## 2015-05-09 ENCOUNTER — Encounter: Payer: Self-pay | Admitting: Family

## 2015-05-09 ENCOUNTER — Ambulatory Visit (INDEPENDENT_AMBULATORY_CARE_PROVIDER_SITE_OTHER): Payer: 59 | Admitting: Family

## 2015-05-09 VITALS — BP 120/88 | HR 74 | Temp 98.1°F | Resp 18 | Ht 61.5 in | Wt 176.0 lb

## 2015-05-09 DIAGNOSIS — R109 Unspecified abdominal pain: Secondary | ICD-10-CM

## 2015-05-09 NOTE — Assessment & Plan Note (Signed)
Flank pain remains of questionable origin between musculoskeletal origin or pelvic phleboths. No evidence of UTI or urinary symptoms. Treat conservatively at this time with OTC medications as needed and home exercise therapy. Follow up pending trial of therapy.

## 2015-05-09 NOTE — Progress Notes (Signed)
Pre visit review using our clinic review tool, if applicable. No additional management support is needed unless otherwise documented below in the visit note. 

## 2015-05-09 NOTE — Progress Notes (Signed)
Subjective:    Patient ID: Kristi Hendrix, female    DOB: January 17, 1966, 49 y.o.   MRN: [id number]  Chief Complaint  Patient presents with  . Hospitalization Follow-up    still has a flank pain but it has eased up a little bit    HPI:  Kristi Hendrix is a 49 y.o. female who  has a past medical history of Asthma; Chicken pox; Seasonal allergies; UTI (lower urinary tract infection); SVD (spontaneous vaginal delivery); Hypothyroidism; History of bronchitis; Detached retina; and Headache. and presents today for an office visit following   1.) Flank pain - Recently seen in the office with flank pain with concern for kidney stones. UA at the time was negative for infection. CT was performed revealing no kidney stones, pelvic phleboliths noted and a potential for ileus. She was directed to the ED for further workup. ED notes with little concern for ileus and believed to be musculoskeletal related and discharged for outpatient workup. All ED records were reviewed in detail.   Since leaving the ED she has had improved symptoms, however continues to experience some flank pain and nausea without any signs of ileus. She was noted to have pelvic phelboth which could be the cause of her pain. Continues to experience the flank pain especially when standing for long periods of time. Denies any fevers, chills, or other urinary symptoms at present.    Allergies  Allergen Reactions  . Aspirin     Makes pt sleepy  . Codeine Itching  . Zofran [Ondansetron Hcl] Hives     Current Outpatient Prescriptions on File Prior to Visit  Medication Sig Dispense Refill  . albuterol (PROVENTIL HFA;VENTOLIN HFA) 108 (90 BASE) MCG/ACT inhaler Inhale 1-2 puffs into the lungs every 6 (six) hours as needed. Shortness of breath 1 Inhaler 3  . albuterol (PROVENTIL) (2.5 MG/3ML) 0.083% nebulizer solution 1 vial every 4-6 hours as needed 30 vial 6  . benzonatate (TESSALON) 200 MG capsule Take 1 capsule (200 mg total) by  mouth 3 (three) times daily as needed for cough. 20 capsule 0  . ciprofloxacin (CIPRO) 500 MG tablet Take 1 tablet (500 mg total) by mouth 2 (two) times daily. 14 tablet 0  . cycloSPORINE (RESTASIS) 0.05 % ophthalmic emulsion Place 1 drop into both eyes 2 (two) times daily.    . montelukast (SINGULAIR) 10 MG tablet Take 1 tablet (10 mg total) by mouth at bedtime. 90 tablet 3  . naproxen sodium (ALEVE) 220 MG tablet Take 220 mg by mouth 2 (two) times daily with a meal.    . omeprazole (PRILOSEC) 20 MG capsule Take 1 capsule (20 mg total) by mouth daily. 30 capsule 0  . SYNTHROID 50 MCG tablet Take 1 tablet (50 mcg total) by mouth daily before breakfast. 30 tablet 1   No current facility-administered medications on file prior to visit.     Past Surgical History  Procedure Laterality Date  . Elbow surgery      right  . Tubal ligation    . Vaginal hysterectomy N/A 11/27/2014    Procedure: HYSTERECTOMY VAGINAL;  Surgeon: Newton Pigg, MD;  Location: [facility];  Service: Gynecology;  Laterality: N/A;     Review of Systems  Constitutional: Negative for fever and chills.  Genitourinary: Positive for flank pain. Negative for dysuria, urgency and frequency.      Objective:    BP 120/88 mmHg  Pulse 74  Temp(Src) 98.1 F (36.7 C) (Oral)  Resp 18  Ht  5' 1.5" (1.562 m)  Wt 176 lb (79.833 kg)  BMI 32.72 kg/m2  SpO2 97%  LMP 11/11/2014 (Approximate) Nursing note and vital signs reviewed.  Physical Exam  Constitutional: She is oriented to person, place, and time. She appears well-developed and well-nourished. No distress.  Cardiovascular: Normal rate, regular rhythm, normal heart sounds and intact distal pulses.   Pulmonary/Chest: Effort normal and breath sounds normal.  Musculoskeletal:  Right flank - No obvious deformity, discoloration or edema noted. Mild tenderness of right flank with no abdominal pain. ROM is WNL. Distal pulses are intact and appropriate.   Neurological: She is alert  and oriented to person, place, and time.  Skin: Skin is warm and dry.  Psychiatric: She has a normal mood and affect. Her behavior is normal. Judgment and thought content normal.       Assessment & Plan:   Problem List Items Addressed This Visit      Other   Flank pain - Primary    Flank pain remains of questionable origin between musculoskeletal origin or pelvic phleboths. No evidence of UTI or urinary symptoms. Treat conservatively at this time with OTC medications as needed and home exercise therapy. Follow up pending trial of therapy.

## 2015-05-09 NOTE — Patient Instructions (Addendum)
Thank you for choosing [facility].  Summary/Instructions:  If your symptoms worsen or fail to improve, please contact our office for further instruction, or in case of emergency go directly to the emergency room at the closest medical facility.    EXERCISES  RANGE OF MOTION (ROM) AND STRETCHING EXERCISES - Low Back Strain Most people with lower back pain will find that their symptoms get worse with excessive bending forward (flexion) or arching at the lower back (extension). The exercises which will help resolve your symptoms will focus on the opposite motion.  Your physician, physical therapist or athletic trainer will help you determine which exercises will be most helpful to resolve your lower back pain. Do not complete any exercises without first consulting with your caregiver. Discontinue any exercises which make your symptoms worse until you speak to your caregiver.  If you have pain, numbness or tingling which travels down into your buttocks, leg or foot, the goal of the therapy is for these symptoms to move closer to your back and eventually resolve. Sometimes, these leg symptoms will get better, but your lower back pain may worsen. This is typically an indication of progress in your rehabilitation. Be very alert to any changes in your symptoms and the activities in which you participated in the 24 hours prior to the change. Sharing this information with your caregiver will allow him/her to most efficiently treat your condition.  These exercises may help you when beginning to rehabilitate your injury. Your symptoms may resolve with or without further involvement from your physician, physical therapist or athletic trainer. While completing these exercises, remember:  Restoring tissue flexibility helps normal motion to return to the joints. This allows healthier, less painful movement and activity.  An effective stretch should be held for at least 30 seconds.  A stretch should never  be painful. You should only feel a gentle lengthening or release in the stretched tissue. FLEXION RANGE OF MOTION AND STRETCHING EXERCISES: STRETCH - Flexion, Single Knee to Chest   Lie on a firm bed or floor with both legs extended in front of you.  Keeping one leg in contact with the floor, bring your opposite knee to your chest. Hold your leg in place by either grabbing behind your thigh or at your knee.  Pull until you feel a gentle stretch in your lower back. Hold __________ seconds.  Slowly release your grasp and repeat the exercise with the opposite side. Repeat __________ times. Complete this exercise __________ times per day.  STRETCH - Flexion, Double Knee to Chest   Lie on a firm bed or floor with both legs extended in front of you.  Keeping one leg in contact with the floor, bring your opposite knee to your chest.  Tense your stomach muscles to support your back and then lift your other knee to your chest. Hold your legs in place by either grabbing behind your thighs or at your knees.  Pull both knees toward your chest until you feel a gentle stretch in your lower back. Hold __________ seconds.  Tense your stomach muscles and slowly return one leg at a time to the floor. Repeat __________ times. Complete this exercise __________ times per day.  STRETCH - Low Trunk Rotation  Lie on a firm bed or floor. Keeping your legs in front of you, bend your knees so they are both pointed toward the ceiling and your feet are flat on the floor.  Extend your arms out to the side. This will stabilize  your upper body by keeping your shoulders in contact with the floor.  Gently and slowly drop both knees together to one side until you feel a gentle stretch in your lower back. Hold for __________ seconds.  Tense your stomach muscles to support your lower back as you bring your knees back to the starting position. Repeat the exercise to the other side. Repeat __________ times. Complete this  exercise __________ times per day  EXTENSION RANGE OF MOTION AND FLEXIBILITY EXERCISES: STRETCH - Extension, Prone on Elbows   Lie on your stomach on the floor, a bed will be too soft. Place your palms about shoulder width apart and at the height of your head.  Place your elbows under your shoulders. If this is too painful, stack pillows under your chest.  Allow your body to relax so that your hips drop lower and make contact more completely with the floor.  Hold this position for __________ seconds.  Slowly return to lying flat on the floor. Repeat __________ times. Complete this exercise __________ times per day.  RANGE OF MOTION - Extension, Prone Press Ups  Lie on your stomach on the floor, a bed will be too soft. Place your palms about shoulder width apart and at the height of your head.  Keeping your back as relaxed as possible, slowly straighten your elbows while keeping your hips on the floor. You may adjust the placement of your hands to maximize your comfort. As you gain motion, your hands will come more underneath your shoulders.  Hold this position __________ seconds.  Slowly return to lying flat on the floor. Repeat __________ times. Complete this exercise __________ times per day.  RANGE OF MOTION- Quadruped, Neutral Spine   Assume a hands and knees position on a firm surface. Keep your hands under your shoulders and your knees under your hips. You may place padding under your knees for comfort.  Drop your head and point your tail bone toward the ground below you. This will round out your lower back like an angry cat. Hold this position for __________ seconds.  Slowly lift your head and release your tail bone so that your back sags into a large arch, like an old horse.  Hold this position for __________ seconds.  Repeat this until you feel limber in your lower back.  Now, find your "sweet spot." This will be the most comfortable position somewhere between the two  previous positions. This is your neutral spine. Once you have found this position, tense your stomach muscles to support your lower back.  Hold this position for __________ seconds. Repeat __________ times. Complete this exercise __________ times per day.  STRENGTHENING EXERCISES - Low Back Strain These exercises may help you when beginning to rehabilitate your injury. These exercises should be done near your "sweet spot." This is the neutral, low-back arch, somewhere between fully rounded and fully arched, that is your least painful position. When performed in this safe range of motion, these exercises can be used for people who have either a flexion or extension based injury. These exercises may resolve your symptoms with or without further involvement from your physician, physical therapist or athletic trainer. While completing these exercises, remember:   Muscles can gain both the endurance and the strength needed for everyday activities through controlled exercises.  Complete these exercises as instructed by your physician, physical therapist or athletic trainer. Increase the resistance and repetitions only as guided.  You may experience muscle soreness or fatigue, but the pain  or discomfort you are trying to eliminate should never worsen during these exercises. If this pain does worsen, stop and make certain you are following the directions exactly. If the pain is still present after adjustments, discontinue the exercise until you can discuss the trouble with your caregiver. STRENGTHENING - Deep Abdominals, Pelvic Tilt  Lie on a firm bed or floor. Keeping your legs in front of you, bend your knees so they are both pointed toward the ceiling and your feet are flat on the floor.  Tense your lower abdominal muscles to press your lower back into the floor. This motion will rotate your pelvis so that your tail bone is scooping upwards rather than pointing at your feet or into the floor.  With a  gentle tension and even breathing, hold this position for __________ seconds. Repeat __________ times. Complete this exercise __________ times per day.  STRENGTHENING - Abdominals, Crunches   Lie on a firm bed or floor. Keeping your legs in front of you, bend your knees so they are both pointed toward the ceiling and your feet are flat on the floor. Cross your arms over your chest.  Slightly tip your chin down without bending your neck.  Tense your abdominals and slowly lift your trunk high enough to just clear your shoulder blades. Lifting higher can put excessive stress on the lower back and does not further strengthen your abdominal muscles.  Control your return to the starting position. Repeat __________ times. Complete this exercise __________ times per day.  STRENGTHENING - Quadruped, Opposite UE/LE Lift   Assume a hands and knees position on a firm surface. Keep your hands under your shoulders and your knees under your hips. You may place padding under your knees for comfort.  Find your neutral spine and gently tense your abdominal muscles so that you can maintain this position. Your shoulders and hips should form a rectangle that is parallel with the floor and is not twisted.  Keeping your trunk steady, lift your right hand no higher than your shoulder and then your left leg no higher than your hip. Make sure you are not holding your breath. Hold this position __________ seconds.  Continuing to keep your abdominal muscles tense and your back steady, slowly return to your starting position. Repeat with the opposite arm and leg. Repeat __________ times. Complete this exercise __________ times per day.  STRENGTHENING - Lower Abdominals, Double Knee Lift  Lie on a firm bed or floor. Keeping your legs in front of you, bend your knees so they are both pointed toward the ceiling and your feet are flat on the floor.  Tense your abdominal muscles to brace your lower back and slowly lift both  of your knees until they come over your hips. Be certain not to hold your breath.  Hold __________ seconds. Using your abdominal muscles, return to the starting position in a slow and controlled manner. Repeat __________ times. Complete this exercise __________ times per day.  POSTURE AND BODY MECHANICS CONSIDERATIONS - Low Back Strain Keeping correct posture when sitting, standing or completing your activities will reduce the stress put on different body tissues, allowing injured tissues a chance to heal and limiting painful experiences. The following are general guidelines for improved posture. Your physician or physical therapist will provide you with any instructions specific to your needs. While reading these guidelines, remember:  The exercises prescribed by your provider will help you have the flexibility and strength to maintain correct postures.  The correct  posture provides the best environment for your joints to work. All of your joints have less wear and tear when properly supported by a spine with good posture. This means you will experience a healthier, less painful body.  Correct posture must be practiced with all of your activities, especially prolonged sitting and standing. Correct posture is as important when doing repetitive low-stress activities (typing) as it is when doing a single heavy-load activity (lifting). RESTING POSITIONS Consider which positions are most painful for you when choosing a resting position. If you have pain with flexion-based activities (sitting, bending, stooping, squatting), choose a position that allows you to rest in a less flexed posture. You would want to avoid curling into a fetal position on your side. If your pain worsens with extension-based activities (prolonged standing, working overhead), avoid resting in an extended position such as sleeping on your stomach. Most people will find more comfort when they rest with their spine in a more neutral  position, neither too rounded nor too arched. Lying on a non-sagging bed on your side with a pillow between your knees, or on your back with a pillow under your knees will often provide some relief. Keep in mind, being in any one position for a prolonged period of time, no matter how correct your posture, can still lead to stiffness. PROPER SITTING POSTURE In order to minimize stress and discomfort on your spine, you must sit with correct posture. Sitting with good posture should be effortless for a healthy body. Returning to good posture is a gradual process. Many people can work toward this most comfortably by using various supports until they have the flexibility and strength to maintain this posture on their own. When sitting with proper posture, your ears will fall over your shoulders and your shoulders will fall over your hips. You should use the back of the chair to support your upper back. Your lower back will be in a neutral position, just slightly arched. You may place a small pillow or folded towel at the base of your lower back for support.  When working at a desk, create an environment that supports good, upright posture. Without extra support, muscles tire, which leads to excessive strain on joints and other tissues. Keep these recommendations in mind: CHAIR:  A chair should be able to slide under your desk when your back makes contact with the back of the chair. This allows you to work closely.  The chair's height should allow your eyes to be level with the upper part of your monitor and your hands to be slightly lower than your elbows. BODY POSITION  Your feet should make contact with the floor. If this is not possible, use a foot rest.  Keep your ears over your shoulders. This will reduce stress on your neck and lower back. INCORRECT SITTING POSTURES  If you are feeling tired and unable to assume a healthy sitting posture, do not slouch or slump. This puts excessive strain on your  back tissues, causing more damage and pain. Healthier options include:  Using more support, like a lumbar pillow.  Switching tasks to something that requires you to be upright or walking.  Talking a brief walk.  Lying down to rest in a neutral-spine position. PROLONGED STANDING WHILE SLIGHTLY LEANING FORWARD  When completing a task that requires you to lean forward while standing in one place for a long time, place either foot up on a stationary 2-4 inch high object to help maintain the best posture.  When both feet are on the ground, the lower back tends to lose its slight inward curve. If this curve flattens (or becomes too large), then the back and your other joints will experience too much stress, tire more quickly, and can cause pain. CORRECT STANDING POSTURES Proper standing posture should be assumed with all daily activities, even if they only take a few moments, like when brushing your teeth. As in sitting, your ears should fall over your shoulders and your shoulders should fall over your hips. You should keep a slight tension in your abdominal muscles to brace your spine. Your tailbone should point down to the ground, not behind your body, resulting in an over-extended swayback posture.  INCORRECT STANDING POSTURES  Common incorrect standing postures include a forward head, locked knees and/or an excessive swayback. WALKING Walk with an upright posture. Your ears, shoulders and hips should all line-up. PROLONGED ACTIVITY IN A FLEXED POSITION When completing a task that requires you to bend forward at your waist or lean over a low surface, try to find a way to stabilize 3 out of 4 of your limbs. You can place a hand or elbow on your thigh or rest a knee on the surface you are reaching across. This will provide you more stability so that your muscles do not fatigue as quickly. By keeping your knees relaxed, or slightly bent, you will also reduce stress across your lower back. CORRECT LIFTING  TECHNIQUES DO :   Assume a wide stance. This will provide you more stability and the opportunity to get as close as possible to the object which you are lifting.  Tense your abdominals to brace your spine. Bend at the knees and hips. Keeping your back locked in a neutral-spine position, lift using your leg muscles. Lift with your legs, keeping your back straight.  Test the weight of unknown objects before attempting to lift them. Try to keep your elbows locked down at your sides in order get the best strength Mid-Back Strain With Rehab  A strain is an injury in which a tendon or muscle is torn. The muscles and tendons of the mid-back are vulnerable to strains. However, these muscles and tendons are very strong and require a great force to be injured. The muscles of the mid-back are responsible for stabilizing the spinal column, as well as spinal twisting (rotation). Strains are classified into three categories. Grade 1 strains cause pain, but the tendon is not lengthened. Grade 2 strains include a lengthened ligament, due to the ligament being stretched or partially ruptured. With grade 2 strains there is still function, although the function may be decreased. Grade 3 strains involve a complete tear of the tendon or muscle, and function is usually impaired. SYMPTOMS  Pain in the middle of the back. Pain that may affect only one side, and is worse with movement. Muscle spasms, and often swelling in the back. Loss of strength of the back muscles. Crackling sound (crepitation) when the muscles are touched. CAUSES  Mid-back strains occur when a force is placed on the muscles or tendons that is greater than they can handle. Common causes of injury include: Ongoing overuse of the muscle-tendon units in the middle back, usually from incorrect body posture. A single violent injury or force applied to the back. RISK INCREASES WITH: Sports that involve twisting forces on the spine or a lot of bending at  the waist (football, rugby, weightlifting, bowling, golf, tennis, speed skating, racquetball, swimming, running, gymnastics, diving). Poor strength  and flexibility. Failure to warm up properly before activity. Family history of low back pain or disk disorders. Previous back injury or surgery (especially fusion). PREVENTION Learn and use proper sports technique. Warm up and stretch properly before activity. Allow for adequate recovery between workouts. Maintain physical fitness: Strength, flexibility, and endurance. Cardiovascular fitness. PROGNOSIS  If treated properly, mid-back strains usually heal within 6 weeks. RELATED COMPLICATIONS  Frequently recurring symptoms, resulting in a chronic problem. Properly treating the problem the first time decreases frequency of recurrence. Chronic inflammation, scarring, and partial muscle-tendon tear. Delayed healing or resolution of symptoms, especially if activity is resumed too soon. Prolonged disability. TREATMENT Treatment first involves the use of ice and medicine, to reduce pain and inflammation. As the pain begins to subside, you may begin strengthening and stretching exercises to improve body posture and sport technique. These exercises may be performed at home or with a therapist. Severe injuries may require referral to a therapist for further evaluation and treatment, such as ultrasound. Corticosteroid injections may be given to help reduce inflammation. Biofeedback (watching monitors of your body processes) and psychotherapy may also be prescribed. Prolonged bed rest is felt to do more harm than good. Massage may help break the muscle spasms. Sometimes, an injection of cortisone, with or without local anesthetics, may be given to help relieve the pain and spasms. MEDICATION  If pain medicine is needed, nonsteroidal anti-inflammatory medicines (aspirin and ibuprofen), or other minor pain relievers (acetaminophen), are often advised. Do not  take pain medicine for 7 days before surgery. Prescription pain relievers may be given, if your caregiver thinks they are needed. Use only as directed and only as much as you need. Ointments applied to the skin may be helpful. Corticosteroid injections may be given by your caregiver. These injections should be reserved for the most serious cases, because they may only be given a certain number of times. HEAT AND COLD:  Cold treatment (icing) should be applied for 10 to 15 minutes every 2 to 3 hours for inflammation and pain, and immediately after activity that aggravates your symptoms. Use ice packs or an ice massage. Heat treatment may be used before performing stretching and strengthening activities prescribed by your caregiver, physical therapist, or athletic trainer. Use a heat pack or a warm water soak. SEEK IMMEDIATE MEDICAL CARE IF: Symptoms get worse or do not improve in 2 to 4 weeks, despite treatment. You develop numbness, weakness, or loss of bowel or bladder function. New, unexplained symptoms develop. (Drugs used in treatment may produce side effects.) EXERCISES RANGE OF MOTION (ROM) AND STRETCHING EXERCISES - Mid-Back Strain These exercises may help you when beginning to rehabilitate your injury. In order to successfully resolve your symptoms, you must improve your posture. These exercises are designed to help reduce the forward-head and rounded-shoulder posture which contributes to this condition. Your symptoms may resolve with or without further involvement from your physician, physical therapist or athletic trainer. While completing these exercises, remember:  Restoring tissue flexibility helps normal motion to return to the joints. This allows healthier, less painful movement and activity. An effective stretch should be held for at least 30 seconds. A stretch should never be painful. You should only feel a gentle lengthening or release in the stretched tissue. STRETCH - Axial  Extension Stand or sit on a firm surface. Assume a good posture: chest up, shoulders drawn back, stomach muscles slightly tense, knees unlocked (if standing) and feet hip width apart. Slowly retract your chin, so your head  slides back and your chin slightly lowers. Continue to look straight ahead. You should feel a gentle stretch in the back of your head. Be certain not to feel an aggressive stretch since this can cause headaches later. Hold for __________ seconds. Repeat __________ times. Complete this exercise __________ times per day. RANGE OF MOTION- Upper Thoracic Extension Sit on a firm chair with a high back. Assume a good posture: chest up, shoulders drawn back, abdominal muscles slightly tense, and feet hip width apart. Place a small pillow or folded towel in the curve of your lower back, if you are having difficulty maintaining good posture. Gently brace your neck with your hands, allowing your arms to rest on your chest. Continue to support your neck and slowly extend your back over the chair. You will feel a stretch across your upper back. Hold __________ seconds. Slowly return to the starting position. Repeat __________ times. Complete this exercise __________ times per day. RANGE OF MOTION- Mid-Thoracic Extension Roll a towel so that it is about 4 inches in diameter. Position the towel lengthwise. Lay on the towel so that your spine, but not your shoulder blades, are supported. You should feel your mid-back arching toward the floor. To increase the stretch, extend your arms away from your body. Hold for __________ seconds. Repeat exercise __________ times, __________ times per day. STRENGTHENING EXERCISES - Mid-Back Strain These exercises may help you when beginning to rehabilitate your injury. They may resolve your symptoms with or without further involvement from your physician, physical therapist or athletic trainer. While completing these exercises, remember:  Muscles can gain  both the endurance and the strength needed for everyday activities through controlled exercises. Complete these exercises as instructed by your physician, physical therapist or athletic trainer. Increase the resistance and repetitions only as guided by your caregiver. You may experience muscle soreness or fatigue, but the pain or discomfort you are trying to eliminate should never worsen during these exercises. If this pain does worsen, stop and make certain you are following the directions exactly. If the pain is still present after adjustments, discontinue the exercise until you can discuss the trouble with your caregiver. STRENGTHENING - Quadruped, Opposite UE/LE Lift Assume a hands and knees position on a firm surface. Keep your hands under your shoulders and your knees under your hips. You may place padding under your knees for comfort. Find your neutral spine and gently tense your abdominal muscles so that you can maintain this position. Your shoulders and hips should form a rectangle that is parallel with the floor and is not twisted. Keeping your trunk steady, lift your right hand no higher than your shoulder and then your left leg no higher than your hip. Make sure you are not holding your breath. Hold this position __________ seconds. Continuing to keep your abdominal muscles tense and your back steady, slowly return to your starting position. Repeat with the opposite arm and leg. Repeat __________ times. Complete this exercise __________ times per day.  STRENGTH - Shoulder Extensors Secure a rubber exercise band or tubing to a fixed object (table, pole) so that it is at the height of your shoulders when you are either standing, or sitting on a firm armless chair. With a thumbs-up grip, grasp an end of the band in each hand. Straighten your elbows and lift your hands straight in front of you at shoulder height. Step back away from the secured end of band, until it becomes tense. Squeezing your  shoulder blades together, pull  your hands down to the sides of your thighs. Do not allow your hands to go behind you. Hold for __________ seconds. Slowly ease the tension on the band, as you reverse the directions and return to the starting position. Repeat __________ times. Complete this exercise __________ times per day.  STRENGTH - Horizontal Abductors Choose one of the two positions to complete this exercise. Prone: lying on stomach: Lie on your stomach on a firm surface so that your right / left arm overhangs the edge. Rest your forehead on your opposite forearm. With your palm facing the floor and your elbow straight, hold a __________ weight in your hand. Squeeze your right / left shoulder blade to your mid-back spine and then slowly raise your arm to the height of the bed. Hold for __________ seconds. Slowly reverse the directions and return to the starting position, controlling the weight as you lower your arm. Repeat __________ times. Complete this exercise __________ times per day. Standing:  Secure a rubber exercise band or tubing, so that it is at the height of your shoulders when you are either standing, or sitting on a firm armless chair. Grasp an end of the band in each hand and have your palms face each other. Straighten your elbows and lift your hands straight in front of you at shoulder height. Step back away from the secured end of band, until it becomes tense. Squeeze your shoulder blades together. Keeping your elbows locked and your hands at shoulder height, spread your arms apart, forming a "T" shape with your body. Hold __________ seconds. Slowly ease the tension on the band, as you reverse the directions and return to the starting position. Repeat __________ times. Complete this exercise __________ times per day. STRENGTH - Scapular Retractors and External Rotators, Rowing Secure a rubber exercise band or tubing, so that it is at the height of your shoulders when you are  either standing, or sitting on a firm armless chair. With a palm-down grip, grasp an end of the band in each hand. Straighten your elbows and lift your hands straight in front of you at shoulder height. Step back away from the secured end of band, until it becomes tense. Step 1: Squeeze your shoulder blades together. Bending your elbows, draw your hands to your chest as if you are rowing a boat. At the end of this motion, your hands and elbow should be at shoulder height and your elbows should be out to your sides. Step 2: Rotate your shoulder to raise your hands above your head. Your forearms should be vertical and your upper arms should be horizontal. Hold for __________ seconds. Slowly ease the tension on the band, as you reverse the directions and return to the starting position. Repeat __________ times. Complete this exercise __________ times per day.  POSTURE AND BODY MECHANICS CONSIDERATIONS - Mid-Back Strain Keeping correct posture when sitting, standing or completing your activities will reduce the stress put on different body tissues, allowing injured tissues a chance to heal and limiting painful experiences. The following are general guidelines for improved posture. Your physician or physical therapist will provide you with any instructions specific to your needs. While reading these guidelines, remember: The exercises prescribed by your provider will help you have the flexibility and strength to maintain correct postures. The correct posture provides the best environment for your joints to work. All of your joints have less wear and tear when properly supported by a spine with good posture. This means you will experience  a healthier, less painful body. Correct posture must be practiced with all of your activities, especially prolonged sitting and standing. Correct posture is as important when doing repetitive low-stress activities (typing) as it is when doing a single heavy-load activity  (lifting). PROPER SITTING POSTURE In order to minimize stress and discomfort on your spine, you must sit with correct posture. Sitting with good posture should be effortless for a healthy body. Returning to good posture is a gradual process. Many people can work toward this most comfortably by using various supports until they have the flexibility and strength to maintain this posture on their own. When sitting with proper posture, your ears will fall over your shoulders and your shoulders will fall over your hips. You should use the back of the chair to support your upper back. Your lower back will be in a neutral position, just slightly arched. You may place a small pillow or folded towel at the base of your low back for  support.  When working at a desk, create an environment that supports good, upright posture. Without extra support, muscles fatigue and lead to excessive strain on joints and other tissues. Keep these recommendations in mind: CHAIR: A chair should be able to slide under your desk when your back makes contact with the back of the chair. This allows you to work closely. The chair's height should allow your eyes to be level with the upper part of your monitor and your hands to be slightly lower than your elbows. BODY POSITION Your feet should make contact with the floor. If this is not possible, use a foot rest. Keep your ears over your shoulders. This will reduce stress on your neck and lower back. INCORRECT SITTING POSTURES If you are feeling tired and unable to assume a healthy sitting posture, do not slouch or slump. This puts excessive strain on your back tissues, causing more damage and pain. Healthier options include: Using more support, like a lumbar pillow. Switching tasks to something that requires you to be upright or walking. Talking a brief walk. Lying down to rest in a neutral-spine position. CORRECT STANDING POSTURES Proper standing posture should be assumed with  all daily activities, even if they only take a few moments, like when brushing your teeth. As in sitting, your ears should fall over your shoulders and your shoulders should fall over your hips. You should keep a slight tension in your abdominal muscles to brace your spine. Your tailbone should point down to the ground, not behind your body, resulting in an over-extended swayback posture.  INCORRECT STANDING POSTURES Common incorrect standing postures include a forward head, locked knees, and an excessive swayback. WALKING Walk with an upright posture. Your ears, shoulders and hips should all line-up. CORRECT LIFTING TECHNIQUES DO :  Assume a wide stance. This will provide you more stability and the opportunity to get as close as possible to the object which you are lifting. Tense your abdominals to brace your spine. Bend at the knees and hips. Keeping your back locked in a neutral-spine position, lift using your leg muscles. Lift with your legs, keeping your back straight. Test the weight of unknown objects before attempting to lift them. Try to keep your elbows locked down at your sides in order get the best strength from your shoulders when carrying an object. Always ask for help when lifting heavy or awkward objects. INCORRECT LIFTING TECHNIQUES DO NOT:  Lock your knees when lifting, even if it is a small object. [employer]  and twist. Pivot at your feet or move your feet when needing to change directions. Assume that you can safely pick up even a paperclip without proper posture.   This information is not intended to replace advice given to you by your health care provider. Make sure you discuss any questions you have with your health care provider.   Document Released: 06/23/2005 Document Revised: 11/07/2014 Document Reviewed: 10/05/2008 Elsevier Interactive Patient Education [employer].  from your shoulders when carrying an object.  Always ask for help when lifting heavy or awkward  objects. INCORRECT LIFTING TECHNIQUES DO NOT:   Lock your knees when lifting, even if it is a small object.  Bend and twist. Pivot at your feet or move your feet when needing to change directions.  Assume that you can safely pick up even a paper clip without proper posture.   This information is not intended to replace advice given to you by your health care provider. Make sure you discuss any questions you have with your health care provider.   Document Released: 06/23/2005 Document Revised: 07/14/2014 Document Reviewed: 10/05/2008 Elsevier Interactive Patient Education [employer].

## 2015-05-22 ENCOUNTER — Other Ambulatory Visit: Payer: Self-pay | Admitting: Family

## 2015-06-20 ENCOUNTER — Encounter: Payer: Self-pay | Admitting: Internal Medicine

## 2015-06-20 ENCOUNTER — Ambulatory Visit (INDEPENDENT_AMBULATORY_CARE_PROVIDER_SITE_OTHER): Payer: 59 | Admitting: Internal Medicine

## 2015-06-20 VITALS — BP 118/84 | HR 82 | Temp 98.2°F | Resp 16 | Ht 61.5 in | Wt 177.0 lb

## 2015-06-20 DIAGNOSIS — J302 Other seasonal allergic rhinitis: Secondary | ICD-10-CM

## 2015-06-20 DIAGNOSIS — B9789 Other viral agents as the cause of diseases classified elsewhere: Secondary | ICD-10-CM

## 2015-06-20 DIAGNOSIS — J069 Acute upper respiratory infection, unspecified: Secondary | ICD-10-CM | POA: Diagnosis not present

## 2015-06-20 DIAGNOSIS — J45901 Unspecified asthma with (acute) exacerbation: Secondary | ICD-10-CM

## 2015-06-20 MED ORDER — MOMETASONE FURO-FORMOTEROL FUM 100-5 MCG/ACT IN AERO: 2.0000 | INHALATION_SPRAY | Freq: Two times a day (BID) | RESPIRATORY_TRACT | Status: DC

## 2015-06-20 MED ORDER — METHYLPREDNISOLONE ACETATE 80 MG/ML IJ SUSP
120.0000 mg | Freq: Once | INTRAMUSCULAR | Status: AC
  Administered 2015-06-20: 120 mg via INTRAMUSCULAR

## 2015-06-20 MED ORDER — PROMETHAZINE-DM 6.25-15 MG/5ML PO SYRP: 5.0000 mL | ORAL_SOLUTION | Freq: Four times a day (QID) | ORAL | Status: DC | PRN

## 2015-06-20 NOTE — Progress Notes (Signed)
Pre visit review using our clinic review tool, if applicable. No additional management support is needed unless otherwise documented below in the visit note. 

## 2015-06-20 NOTE — Progress Notes (Signed)
Subjective:  Patient ID: Kristi Hendrix, female    DOB: Nov 20, 1965  Age: 49 y.o. MRN: [id number]  CC: Cough   HPI Kristi Hendrix presents for a 5 day history of nonproductive cough with chills but no fever. She is also had a sore throat. She has a history of asthma and complains of wheezing. She's had a mild headache and earaches.  Outpatient Prescriptions Prior to Visit  Medication Sig Dispense Refill  . albuterol (PROVENTIL HFA;VENTOLIN HFA) 108 (90 BASE) MCG/ACT inhaler Inhale 1-2 puffs into the lungs every 6 (six) hours as needed. Shortness of breath 1 Inhaler 3  . albuterol (PROVENTIL) (2.5 MG/3ML) 0.083% nebulizer solution 1 vial every 4-6 hours as needed 30 vial 6  . cycloSPORINE (RESTASIS) 0.05 % ophthalmic emulsion Place 1 drop into both eyes 2 (two) times daily.    . montelukast (SINGULAIR) 10 MG tablet Take 1 tablet (10 mg total) by mouth at bedtime. 90 tablet 3  . naproxen sodium (ALEVE) 220 MG tablet Take 220 mg by mouth 2 (two) times daily with a meal.    . omeprazole (PRILOSEC) 20 MG capsule Take 1 capsule (20 mg total) by mouth daily. 30 capsule 0  . SYNTHROID 50 MCG tablet TAKE ONE TABLET BY MOUTH ONCE DAILY BEFORE BREAKFAST 30 tablet 0  . benzonatate (TESSALON) 200 MG capsule Take 1 capsule (200 mg total) by mouth 3 (three) times daily as needed for cough. 20 capsule 0  . ciprofloxacin (CIPRO) 500 MG tablet Take 1 tablet (500 mg total) by mouth 2 (two) times daily. 14 tablet 0   No facility-administered medications prior to visit.    ROS Review of Systems  Constitutional: Positive for chills. Negative for fever, diaphoresis, appetite change, fatigue and unexpected weight change.  HENT: Positive for congestion, postnasal drip, rhinorrhea, sneezing and sore throat. Negative for facial swelling, sinus pressure and trouble swallowing.   Eyes: Negative.   Respiratory: Positive for cough and wheezing. Negative for apnea, choking, chest tightness, shortness of breath  and stridor.   Cardiovascular: Negative.  Negative for chest pain, palpitations and leg swelling.  Gastrointestinal: Negative.  Negative for nausea, vomiting, abdominal pain, diarrhea and constipation.  Endocrine: Negative.   Genitourinary: Negative.   Musculoskeletal: Negative.   Skin: Negative.  Negative for color change and rash.  Allergic/Immunologic: Negative.   Neurological: Negative.   Hematological: Negative.  Negative for adenopathy. Does not bruise/bleed easily.  Psychiatric/Behavioral: Negative.     Objective:  BP 118/84 mmHg  Pulse 82  Temp(Src) 98.2 F (36.8 C) (Oral)  Resp 16  Ht 5' 1.5" (1.562 m)  Wt 177 lb (80.287 kg)  BMI 32.91 kg/m2  SpO2 97%  LMP 11/11/2014 (Approximate)  BP Readings from Last 3 Encounters:  06/20/15 118/84  05/09/15 120/88  05/03/15 116/75    Wt Readings from Last 3 Encounters:  06/20/15 177 lb (80.287 kg)  05/09/15 176 lb (79.833 kg)  05/02/15 174 lb 12.8 oz (79.289 kg)    Physical Exam  Constitutional: She is oriented to person, place, and time. She appears well-developed and well-nourished.  Non-toxic appearance. She does not have a sickly appearance. She does not appear ill. No distress.  HENT:  Head: Normocephalic and atraumatic.  Right Ear: Hearing, tympanic membrane, external ear and ear canal normal.  Left Ear: Hearing, tympanic membrane, external ear and ear canal normal.  Nose: No mucosal edema or rhinorrhea. Right sinus exhibits no maxillary sinus tenderness and no frontal sinus tenderness. Left sinus exhibits no  maxillary sinus tenderness and no frontal sinus tenderness.  Mouth/Throat: Oropharynx is clear and moist and mucous membranes are normal. Mucous membranes are not pale, not dry and not cyanotic. No uvula swelling. No oropharyngeal exudate, posterior oropharyngeal edema, posterior oropharyngeal erythema or tonsillar abscesses.  Eyes: Conjunctivae are normal. Right eye exhibits no discharge. Left eye exhibits no  discharge. No scleral icterus.  Neck: Normal range of motion. Neck supple. No JVD present. No tracheal deviation present. No thyromegaly present.  Cardiovascular: Normal rate, regular rhythm, normal heart sounds and intact distal pulses.  Exam reveals no gallop and no friction rub.   No murmur heard. Pulmonary/Chest: Effort normal and breath sounds normal. No stridor. No respiratory distress. She has no wheezes. She has no rales. She exhibits no tenderness.  Abdominal: Soft. Bowel sounds are normal. She exhibits no distension and no mass. There is no tenderness. There is no rebound and no guarding.  Musculoskeletal: Normal range of motion. She exhibits no edema or tenderness.  Lymphadenopathy:    She has no cervical adenopathy.  Neurological: She is oriented to person, place, and time.  Skin: Skin is warm and dry. No rash noted. She is not diaphoretic. No erythema. No pallor.  Vitals reviewed.   Lab Results  Component Value Date   WBC 7.0 05/03/2015   HGB 14.6 05/03/2015   HCT 43.6 05/03/2015   PLT 359 05/03/2015   GLUCOSE 90 05/03/2015   CHOL 191 03/13/2015   TRIG 90.0 03/13/2015   HDL 70.60 03/13/2015   LDLCALC 102* 03/13/2015   ALT 17 05/03/2015   AST 20 05/03/2015   NA 141 05/03/2015   K 4.6 05/03/2015   CL 107 05/03/2015   CREATININE 0.98 05/03/2015   BUN 7 05/03/2015   CO2 25 05/03/2015   TSH 2.72 03/13/2015    No results found.  Assessment & Plan:   Kristi Hendrix was seen today for cough.  Diagnoses and all orders for this visit:  Asthma, unspecified asthma severity, with acute exacerbation- she is having a flareup of asthma so I gave her an injection of Depo-Medrol. I've also asked her to restart Dulera. -     mometasone-formoterol (DULERA) 100-5 MCG/ACT AERO; Inhale 2 puffs into the lungs 2 (two) times daily. -     methylPREDNISolone acetate (DEPO-MEDROL) injection 120 mg; Inject 1.5 mLs (120 mg total) into the muscle once.  Seasonal allergies- she is having a  flareup of her symptoms so I gave her an injection of Depo-Medrol  Viral URI with cough- this is viral so I did not prescribe antibiotics. She will take Phenergan DM as needed for the cough. -     promethazine-dextromethorphan (PROMETHAZINE-DM) 6.25-15 MG/5ML syrup; Take 5 mLs by mouth 4 (four) times daily as needed for cough.  I have discontinued Kristi Hendrix's benzonatate, ciprofloxacin, and RESTASIS. I am also having her start on mometasone-formoterol and promethazine-dextromethorphan. Additionally, I am having her maintain her albuterol, montelukast, albuterol, naproxen sodium, cycloSPORINE, omeprazole, and SYNTHROID. We administered methylPREDNISolone acetate.  Meds ordered this encounter  Medications  . DISCONTD: SYNTHROID 50 MCG tablet    Sig:   . DISCONTD: RESTASIS 0.05 % ophthalmic emulsion    Sig:   . mometasone-formoterol (DULERA) 100-5 MCG/ACT AERO    Sig: Inhale 2 puffs into the lungs 2 (two) times daily.    Dispense:  13 g    Refill:  11  . promethazine-dextromethorphan (PROMETHAZINE-DM) 6.25-15 MG/5ML syrup    Sig: Take 5 mLs by mouth 4 (four) times daily as  needed for cough.    Dispense:  118 mL    Refill:  0  . methylPREDNISolone acetate (DEPO-MEDROL) injection 120 mg    Sig:      Follow-up: Return if symptoms worsen or fail to improve.  Scarlette Calico, MD

## 2015-06-20 NOTE — Patient Instructions (Signed)

## 2015-06-25 ENCOUNTER — Telehealth: Payer: Self-pay

## 2015-06-25 NOTE — Telephone Encounter (Signed)
PA initiated via covermymeds. Key for PA is [id number]

## 2015-06-29 ENCOUNTER — Other Ambulatory Visit: Payer: Self-pay | Admitting: Family

## 2015-07-03 ENCOUNTER — Other Ambulatory Visit: Payer: Self-pay

## 2015-07-03 MED ORDER — BUDESONIDE-FORMOTEROL FUMARATE 160-4.5 MCG/ACT IN AERO
2.0000 | INHALATION_SPRAY | Freq: Two times a day (BID) | RESPIRATORY_TRACT | Status: DC
Start: 2015-07-03 — End: 2016-05-28

## 2015-07-03 NOTE — Telephone Encounter (Signed)
Pt called in and stated the insurance will not cover the [facility] but will cover Symbicort. Also, she was given a sample of Qnasal when she went to a Sat clinic and she is needing a prescription for that. Pharmacy is [facility] [address]

## 2015-07-03 NOTE — Telephone Encounter (Signed)
Have already sent the Symbicort. Is it ok to send the Qnasl

## 2015-07-04 NOTE — Telephone Encounter (Signed)
Symbicort is fine and does not need the Qnasal.

## 2015-07-28 ENCOUNTER — Encounter: Payer: Self-pay | Admitting: Family

## 2015-08-17 ENCOUNTER — Other Ambulatory Visit: Payer: Self-pay | Admitting: Family

## 2015-08-17 DIAGNOSIS — R21 Rash and other nonspecific skin eruption: Secondary | ICD-10-CM

## 2015-08-20 ENCOUNTER — Encounter: Payer: Self-pay | Admitting: Family

## 2015-08-20 ENCOUNTER — Other Ambulatory Visit (INDEPENDENT_AMBULATORY_CARE_PROVIDER_SITE_OTHER): Payer: BLUE CROSS/BLUE SHIELD

## 2015-08-20 DIAGNOSIS — E039 Hypothyroidism, unspecified: Secondary | ICD-10-CM | POA: Diagnosis not present

## 2015-08-20 LAB — TSH: TSH: 1.76 u[IU]/mL (ref 0.35–4.50)

## 2015-10-09 ENCOUNTER — Encounter: Payer: Self-pay | Admitting: Family

## 2015-11-02 ENCOUNTER — Encounter: Payer: Self-pay | Admitting: Family

## 2015-12-26 ENCOUNTER — Encounter: Payer: Self-pay | Admitting: Family

## 2015-12-26 DIAGNOSIS — E039 Hypothyroidism, unspecified: Secondary | ICD-10-CM

## 2015-12-27 ENCOUNTER — Other Ambulatory Visit (INDEPENDENT_AMBULATORY_CARE_PROVIDER_SITE_OTHER): Payer: BLUE CROSS/BLUE SHIELD

## 2015-12-27 ENCOUNTER — Encounter: Payer: Self-pay | Admitting: Family

## 2015-12-27 DIAGNOSIS — E039 Hypothyroidism, unspecified: Secondary | ICD-10-CM

## 2015-12-27 LAB — TSH: TSH: 1.62 u[IU]/mL (ref 0.35–4.50)

## 2016-02-20 ENCOUNTER — Encounter: Payer: Self-pay | Admitting: Family

## 2016-02-20 ENCOUNTER — Ambulatory Visit (INDEPENDENT_AMBULATORY_CARE_PROVIDER_SITE_OTHER): Payer: BLUE CROSS/BLUE SHIELD | Admitting: Family

## 2016-02-20 ENCOUNTER — Other Ambulatory Visit (INDEPENDENT_AMBULATORY_CARE_PROVIDER_SITE_OTHER): Payer: BLUE CROSS/BLUE SHIELD

## 2016-02-20 VITALS — BP 132/88 | HR 69 | Temp 97.5°F | Resp 16 | Ht 61.5 in | Wt 154.0 lb

## 2016-02-20 DIAGNOSIS — G44219 Episodic tension-type headache, not intractable: Secondary | ICD-10-CM | POA: Diagnosis not present

## 2016-02-20 DIAGNOSIS — R519 Headache, unspecified: Secondary | ICD-10-CM | POA: Insufficient documentation

## 2016-02-20 DIAGNOSIS — Z0000 Encounter for general adult medical examination without abnormal findings: Secondary | ICD-10-CM

## 2016-02-20 DIAGNOSIS — E039 Hypothyroidism, unspecified: Secondary | ICD-10-CM | POA: Diagnosis not present

## 2016-02-20 DIAGNOSIS — R51 Headache: Secondary | ICD-10-CM

## 2016-02-20 LAB — LIPID PANEL
CHOL/HDL RATIO: 3
Cholesterol: 173 mg/dL (ref 0–200)
HDL: 63.3 mg/dL (ref >39.00)
LDL CALC: 94 mg/dL (ref 0–99)
NonHDL: 109.42
TRIGLYCERIDES: 79 mg/dL (ref 0.0–149.0)
VLDL: 15.8 mg/dL (ref 0.0–40.0)

## 2016-02-20 LAB — TSH: TSH: 1.57 u[IU]/mL (ref 0.35–4.50)

## 2016-02-20 NOTE — Progress Notes (Signed)
Subjective:    Patient ID: Kristi Hendrix, female    DOB: 03/04/1966, 50 y.o.   MRN: [id number]  Chief Complaint  Patient presents with  . Hypertension    fasting for cholesterol check, starting yesterday morning at work her bp was 150/100, has a headache and pressure behind her eyes, does have hx of aneurysms in family    HPI:  Kristi Hendrix is a 50 y.o. female who  has a past medical history of Asthma; Chicken pox; Detached retina; Headache; History of bronchitis; Hypothyroidism; Seasonal allergies; SVD (spontaneous vaginal delivery); and UTI (lower urinary tract infection). and presents today For an office visit.  1.) Elevated blood pressure - this is a new problem. Associated symptom of elevated blood pressure while at work with symptoms including a headache and pressure behind her eyes has been going on for approximately 24 hours. Symptoms have improved since initial onset. She continues to have a headache. Pain is described as a constant pain with yesterday feeling a pulsatile feeling. Has also started dizziness. Has been following a low carb diet averaging about 15 g of carbohydrate per day. She would like to have her cholesterol checked. Has a history of aneurysms in the family. Experiencing a significant amount of family and person stress as well.   2.) Hypothyroidism - currently maintained on Synthroid. Reports taking medication as prescribed and denies adverse side effects. Notes her symptoms are generally adequate control with current medication regimen.   Allergies  Allergen Reactions  . Aspirin     Makes pt sleepy  . Codeine Itching  . Zofran [Ondansetron Hcl] Hives     Current Outpatient Prescriptions on File Prior to Visit  Medication Sig Dispense Refill  . albuterol (PROVENTIL HFA;VENTOLIN HFA) 108 (90 BASE) MCG/ACT inhaler Inhale 1-2 puffs into the lungs every 6 (six) hours as needed. Shortness of breath 1 Inhaler 3  . albuterol (PROVENTIL) (2.5 MG/3ML)  0.083% nebulizer solution 1 vial every 4-6 hours as needed 30 vial 6  . budesonide-formoterol (SYMBICORT) 160-4.5 MCG/ACT inhaler Inhale 2 puffs into the lungs 2 (two) times daily. 1 Inhaler 3  . cycloSPORINE (RESTASIS) 0.05 % ophthalmic emulsion Place 1 drop into both eyes 2 (two) times daily.    . mometasone-formoterol (DULERA) 100-5 MCG/ACT AERO Inhale 2 puffs into the lungs 2 (two) times daily. 13 g 11  . montelukast (SINGULAIR) 10 MG tablet Take 1 tablet (10 mg total) by mouth at bedtime. 90 tablet 3  . SYNTHROID 50 MCG tablet TAKE ONE TABLET BY MOUTH ONCE DAILY BEFORE BREAKFAST 90 tablet 2   No current facility-administered medications on file prior to visit.     Past Medical History:  Diagnosis Date  . Asthma   . Chicken pox   . Detached retina    left eye, will have surgery to repair after recovering from GYN surgery  . Headache    otc med prn  . History of bronchitis   . Hypothyroidism   . Seasonal allergies   . SVD (spontaneous vaginal delivery)    x 2  . UTI (lower urinary tract infection)     Review of Systems  Constitutional: Negative for chills and fever.  Eyes: Negative for visual disturbance.  Respiratory: Negative for chest tightness and shortness of breath.   Cardiovascular: Negative for chest pain, palpitations and leg swelling.  Gastrointestinal: Negative for diarrhea, nausea and vomiting.  Musculoskeletal: Negative for neck pain and neck stiffness.  Neurological: Positive for headaches. Negative for syncope, weakness  and numbness.      Objective:    BP 132/88 (BP Location: Left Arm, Patient Position: Sitting, Cuff Size: Normal)   Pulse 69   Temp 97.5 F (36.4 C) (Oral)   Resp 16   Ht 5' 1.5" (1.562 m)   Wt 154 lb (69.9 kg)   LMP 11/11/2014 (Approximate)   SpO2 98%   BMI 28.63 kg/m  Nursing note and vital signs reviewed.  Physical Exam  Constitutional: She is oriented to person, place, and time. She appears well-developed and well-nourished. No  distress.  Eyes: Conjunctivae and EOM are normal. Pupils are equal, round, and reactive to light.  Fundoscopic exam:      The right eye shows no hemorrhage and no papilledema. The right eye shows red reflex.       The left eye shows no hemorrhage and no papilledema. The left eye shows red reflex.  Cardiovascular: Normal rate, regular rhythm, normal heart sounds and intact distal pulses.   Pulmonary/Chest: Effort normal and breath sounds normal.  Neurological: She is alert and oriented to person, place, and time. No cranial nerve deficit. Coordination normal.  Skin: Skin is warm and dry.  Psychiatric: She has a normal mood and affect. Her behavior is normal. Judgment and thought content normal.       Assessment & Plan:   Problem List Items Addressed This Visit      Endocrine   Hypothyroidism    Appears stable with current regimen with no adverse side effects. Obtain TSH. Continue current dosage of Synthroid pending TSH results.      Relevant Orders   TSH (Completed)     Other   Headache - Primary    Symptoms and exam consistent with generalized headache most likely tension related secondary to stress. Neurological and eye exam is normal with no papilledema or cranial nerve deficits. Treated conservatively with over-the-counter Excedrin, ibuprofen, or Tylenol as needed. Denies worse headache of life or symptoms of end organ damage. Elevation in blood pressure is most likely related to pain. If symptoms worsen consider further imaging.      Routine history and physical examination of adult   Relevant Orders   Lipid Profile (Completed)    Other Visit Diagnoses   None.     I have discontinued Ms. Zelek's naproxen sodium, omeprazole, and promethazine-dextromethorphan. I am also having her maintain her albuterol, montelukast, albuterol, cycloSPORINE, mometasone-formoterol, SYNTHROID, and budesonide-formoterol.   Follow-up: Return in about 2 months (around 04/21/2016), or if  symptoms worsen or fail to improve.  Mauricio Po, FNP

## 2016-02-20 NOTE — Assessment & Plan Note (Signed)
Symptoms and exam consistent with generalized headache most likely tension related secondary to stress. Neurological and eye exam is normal with no papilledema or cranial nerve deficits. Treated conservatively with over-the-counter Excedrin, ibuprofen, or Tylenol as needed. Denies worse headache of life or symptoms of end organ damage. Elevation in blood pressure is most likely related to pain. If symptoms worsen consider further imaging.

## 2016-02-20 NOTE — Patient Instructions (Signed)
Thank you for choosing [facility].  Summary/Instructions:  Please continue to take your medications as prescribed.  Try ibuprofen or Excedrin for your headaches.  If they continue to worsen will send your for imaging.   Please stop by the lab on the lower level of the building for your blood work. Your results will be released to [place] (or called to you) after review, usually within 72 hours after test completion. If any changes need to be made, you will be notified at that same time.  1. The lab is open from 7:30am to 5:30 pm Monday-Friday  2. No appointment is necessary  3. Fasting (if needed) is 6-8 hours after food and drink; black coffee and  water are okay   If your symptoms worsen or fail to improve, please contact our office for further instruction, or in case of emergency go directly to the emergency room at the closest medical facility.    Tension Headache A tension headache is a feeling of pain, pressure, or aching that is often felt over the front and sides of the head. The pain can be dull, or it can feel tight (constricting). Tension headaches are not normally associated with nausea or vomiting, and they do not get worse with physical activity. Tension headaches can last from 30 minutes to several days. This is the most common type of headache. CAUSES The exact cause of this condition is not known. Tension headaches often begin after stress, anxiety, or depression. Other triggers may include:  Alcohol.  Too much caffeine, or caffeine withdrawal.  Respiratory infections, such as colds, flu, or sinus infections.  Dental problems or teeth clenching.  Fatigue.  Holding your head and neck in the same position for a long period of time, such as while using a computer.  Smoking. SYMPTOMS Symptoms of this condition include:  A feeling of pressure around the head.  Dull, aching head pain.  Pain felt over the front and sides of the head.  Tenderness in the  muscles of the head, neck, and shoulders. DIAGNOSIS This condition may be diagnosed based on your symptoms and a physical exam. Tests may be done, such as a CT scan or an MRI of your head. These tests may be done if your symptoms are severe or unusual. TREATMENT This condition may be treated with lifestyle changes and medicines to help relieve symptoms. HOME CARE INSTRUCTIONS Managing Pain  Take over-the-counter and prescription medicines only as told by your health care provider.  Lie down in a dark, quiet room when you have a headache.  If directed, apply ice to the head and neck area:  Put ice in a plastic bag.  Place a towel between your skin and the bag.  Leave the ice on for 20 minutes, 2-3 times per day.  Use a heating pad or a hot shower to apply heat to the head and neck area as told by your health care provider. Eating and Drinking  Eat meals on a regular schedule.  Limit alcohol use.  Decrease your caffeine intake, or stop using caffeine. General Instructions  Keep all follow-up visits as told by your health care provider. This is important.  Keep a headache journal to help find out what may trigger your headaches. For example, write down:  What you eat and drink.  How much sleep you get.  Any change to your diet or medicines.  Try massage or other relaxation techniques.  Limit stress.  Sit up straight, and avoid tensing your  muscles.  Do not use tobacco products, including cigarettes, chewing tobacco, or e-cigarettes. If you need help quitting, ask your health care provider.  Exercise regularly as told by your health care provider.  Get 7-9 hours of sleep, or the amount recommended by your health care provider. SEEK MEDICAL CARE IF:  Your symptoms are not helped by medicine.  You have a headache that is different from what you normally experience.  You have nausea or you vomit.  You have a fever. SEEK IMMEDIATE MEDICAL CARE IF:  Your headache  becomes severe.  You have repeated vomiting.  You have a stiff neck.  You have a loss of vision.  You have problems with speech.  You have pain in your eye or ear.  You have muscular weakness or loss of muscle control.  You lose your balance or you have trouble walking.  You feel faint or you pass out.  You have confusion.   This information is not intended to replace advice given to you by your health care provider. Make sure you discuss any questions you have with your health care provider.   Document Released: 06/23/2005 Document Revised: 03/14/2015 Document Reviewed: 10/16/2014 Elsevier Interactive Patient Education [employer].

## 2016-02-20 NOTE — Assessment & Plan Note (Signed)
Appears stable with current regimen with no adverse side effects. Obtain TSH. Continue current dosage of Synthroid pending TSH results.

## 2016-02-24 ENCOUNTER — Other Ambulatory Visit: Payer: Self-pay | Admitting: Family

## 2016-02-24 DIAGNOSIS — G44219 Episodic tension-type headache, not intractable: Secondary | ICD-10-CM

## 2016-03-05 ENCOUNTER — Encounter: Payer: Self-pay | Admitting: Family

## 2016-03-05 ENCOUNTER — Ambulatory Visit (INDEPENDENT_AMBULATORY_CARE_PROVIDER_SITE_OTHER)
Admission: RE | Admit: 2016-03-05 | Discharge: 2016-03-05 | Disposition: A | Discharge status: Home / Self Care | Payer: BLUE CROSS/BLUE SHIELD | Source: Ambulatory Visit | Attending: Family | Admitting: Family

## 2016-03-05 DIAGNOSIS — G44219 Episodic tension-type headache, not intractable: Secondary | ICD-10-CM

## 2016-03-31 ENCOUNTER — Other Ambulatory Visit: Payer: Self-pay | Admitting: Family Medicine

## 2016-04-20 ENCOUNTER — Other Ambulatory Visit: Payer: Self-pay | Admitting: Family

## 2016-04-23 ENCOUNTER — Encounter: Payer: Self-pay | Admitting: Adult Health

## 2016-04-23 ENCOUNTER — Ambulatory Visit (INDEPENDENT_AMBULATORY_CARE_PROVIDER_SITE_OTHER): Payer: BLUE CROSS/BLUE SHIELD | Admitting: Adult Health

## 2016-04-23 VITALS — BP 124/70 | Temp 98.1°F | Ht 61.5 in | Wt 151.8 lb

## 2016-04-23 DIAGNOSIS — J028 Acute pharyngitis due to other specified organisms: Principal | ICD-10-CM

## 2016-04-23 DIAGNOSIS — J029 Acute pharyngitis, unspecified: Secondary | ICD-10-CM

## 2016-04-23 DIAGNOSIS — B9789 Other viral agents as the cause of diseases classified elsewhere: Secondary | ICD-10-CM

## 2016-04-23 LAB — POCT RAPID STREP A (OFFICE): Rapid Strep A Screen: NEGATIVE

## 2016-04-23 MED ORDER — MAGIC MOUTHWASH W/LIDOCAINE: 5.0000 mL | Freq: Three times a day (TID) | ORAL | 0 refills | Status: DC | PRN

## 2016-04-23 MED ORDER — FLUTICASONE PROPIONATE 50 MCG/ACT NA SUSP: 2.0000 | Freq: Every day | NASAL | 6 refills | Status: DC

## 2016-04-23 NOTE — Patient Instructions (Addendum)
It was great meeting you today!  Your strep was negative. This is likely a viral sore throat. Please use the magic mouth was three times a day as needed  You can take Tylenol or Motrin to help with symptoms relief.   Please follow up if no improvement in the next 2-3 days    Sore Throat A sore throat is pain, burning, irritation, or scratchiness of the throat. There is often pain or tenderness when swallowing or talking. A sore throat may be accompanied by other symptoms, such as coughing, sneezing, fever, and swollen neck glands. A sore throat is often the first sign of another sickness, such as a cold, flu, strep throat, or mononucleosis (commonly known as mono). Most sore throats go away without medical treatment. CAUSES  The most common causes of a sore throat include:  A viral infection, such as a cold, flu, or mono.  A bacterial infection, such as strep throat, tonsillitis, or whooping cough.  Seasonal allergies.  Dryness in the air.  Irritants, such as smoke or pollution.  Gastroesophageal reflux disease (GERD). HOME CARE INSTRUCTIONS   Only take over-the-counter medicines as directed by your caregiver.  Drink enough fluids to keep your urine clear or pale yellow.  Rest as needed.  Try using throat sprays, lozenges, or sucking on hard candy to ease any pain (if older than 4 years or as directed).  Sip warm liquids, such as broth, herbal tea, or warm water with honey to relieve pain temporarily. You may also eat or drink cold or frozen liquids such as frozen ice pops.  Gargle with salt water (mix 1 tsp salt with 8 oz of water).  Do not smoke and avoid secondhand smoke.  Put a cool-mist humidifier in your bedroom at night to moisten the air. You can also turn on a hot shower and sit in the bathroom with the door closed for 5-10 minutes. SEEK IMMEDIATE MEDICAL CARE IF:  You have difficulty breathing.  You are unable to swallow fluids, soft foods, or your  saliva.  You have increased swelling in the throat.  Your sore throat does not get better in 7 days.  You have nausea and vomiting.  You have a fever or persistent symptoms for more than 2-3 days.  You have a fever and your symptoms suddenly get worse. MAKE SURE YOU:   Understand these instructions.  Will watch your condition.  Will get help right away if you are not doing well or get worse.   This information is not intended to replace advice given to you by your health care provider. Make sure you discuss any questions you have with your health care provider.   Document Released: 07/31/2004 Document Revised: 07/14/2014 Document Reviewed: 02/29/2012 Elsevier Interactive Patient Education [employer].

## 2016-04-23 NOTE — Progress Notes (Signed)
Subjective:    Patient ID: Kristi Hendrix, female    DOB: Aug 25, 1965, 50 y.o.   MRN: [id number]  Sore Throat   This is a new problem. The current episode started in the past 7 days (4 days ). Neither side of throat is experiencing more pain than the other. There has been no fever. Associated symptoms include coughing, a plugged ear sensation and shortness of breath. Pertinent negatives include no congestion, diarrhea, ear discharge, ear pain, swollen glands or trouble swallowing. She has had exposure to strep. She has had no exposure to mono. She has tried nothing for the symptoms. The treatment provided no relief.      Review of Systems  HENT: Positive for rhinorrhea and sore throat. Negative for congestion, ear discharge, ear pain, facial swelling, postnasal drip, sinus pressure, trouble swallowing and voice change.   Eyes: Negative.   Respiratory: Positive for cough and shortness of breath.   Cardiovascular: Negative.   Gastrointestinal: Negative for diarrhea.  Musculoskeletal: Negative.   Skin: Negative.   Neurological: Negative.   All other systems reviewed and are negative.  Past Medical History:  Diagnosis Date  . Asthma   . Chicken pox   . Detached retina    left eye, will have surgery to repair after recovering from GYN surgery  . Headache    otc med prn  . History of bronchitis   . Hypothyroidism   . Seasonal allergies   . SVD (spontaneous vaginal delivery)    x 2  . UTI (lower urinary tract infection)     Social History   Social History  . Marital status: Married    Spouse name: N/A  . Number of children: 2  . Years of education: 12   Occupational History  . Driver    Social History Main Topics  . Smoking status: Never Smoker  . Smokeless tobacco: Never Used  . Alcohol use Yes     Comment: Social drinker  . Drug use: No  . Sexual activity: Yes    Birth control/ protection: Surgical   Other Topics Concern  . Not on file   Social History  Narrative   Currently resides with husband. Fun: Hike, camp, fish and work in her yard.   Denies religious beliefs effecting healthcare.     Past Surgical History:  Procedure Laterality Date  . ELBOW SURGERY     right  . TUBAL LIGATION    . VAGINAL HYSTERECTOMY N/A 11/27/2014   Procedure: HYSTERECTOMY VAGINAL;  Surgeon: Newton Pigg, MD;  Location: [facility];  Service: Gynecology;  Laterality: N/A;    Family History  Problem Relation Age of Onset  . Alcohol abuse Mother   . Alcohol abuse Father   . Diabetes Father   . Heart disease Maternal Grandfather     Allergies  Allergen Reactions  . Aspirin     Makes pt sleepy  . Codeine Itching  . Zofran [Ondansetron Hcl] Hives    Current Outpatient Prescriptions on File Prior to Visit  Medication Sig Dispense Refill  . albuterol (PROVENTIL HFA;VENTOLIN HFA) 108 (90 BASE) MCG/ACT inhaler Inhale 1-2 puffs into the lungs every 6 (six) hours as needed. Shortness of breath 1 Inhaler 3  . albuterol (PROVENTIL) (2.5 MG/3ML) 0.083% nebulizer solution 1 vial every 4-6 hours as needed 30 vial 6  . budesonide-formoterol (SYMBICORT) 160-4.5 MCG/ACT inhaler Inhale 2 puffs into the lungs 2 (two) times daily. 1 Inhaler 3  . cycloSPORINE (RESTASIS) 0.05 % ophthalmic emulsion Place  1 drop into both eyes 2 (two) times daily.    Marland Kitchen levothyroxine (SYNTHROID, LEVOTHROID) 50 MCG tablet TAKE 1 TABLET BY MOUTH EVERY DAY BEFORE BREAKFAST 90 tablet 2  . mometasone-formoterol (DULERA) 100-5 MCG/ACT AERO Inhale 2 puffs into the lungs 2 (two) times daily. 13 g 11  . montelukast (SINGULAIR) 10 MG tablet TAKE 1 TABLET BY MOUTH AT BEDTIME 90 tablet 1   No current facility-administered medications on file prior to visit.     BP 124/70   Temp 98.1 F (36.7 C) (Oral)   Ht 5' 1.5" (1.562 m)   Wt 151 lb 12.8 oz (68.9 kg)   LMP 11/11/2014 (Approximate)   BMI 28.22 kg/m       Objective:   Physical Exam  Constitutional: She is oriented to person, place, and time.  She appears well-developed and well-nourished. No distress.  HENT:  Head: Normocephalic and atraumatic.  Right Ear: Hearing, tympanic membrane, external ear and ear canal normal. Tympanic membrane is not erythematous and not bulging.  Left Ear: Hearing, external ear and ear canal normal. Tympanic membrane is not erythematous and not bulging.  Nose: Nose normal. No mucosal edema or rhinorrhea. Right sinus exhibits no maxillary sinus tenderness and no frontal sinus tenderness. Left sinus exhibits no frontal sinus tenderness.  Mouth/Throat: Uvula is midline and mucous membranes are normal. Posterior oropharyngeal erythema present. No oropharyngeal exudate, posterior oropharyngeal edema or tonsillar abscesses.  Eyes: Conjunctivae and EOM are normal. Pupils are equal, round, and reactive to light. Right eye exhibits no discharge. Left eye exhibits no discharge. No scleral icterus.  Neck: Normal range of motion. Neck supple. No thyromegaly present.  Cardiovascular: Normal rate, regular rhythm, normal heart sounds and intact distal pulses.  Exam reveals no gallop and no friction rub.   No murmur heard. Pulmonary/Chest: Effort normal and breath sounds normal. No respiratory distress. She has no wheezes. She has no rales. She exhibits no tenderness.  Lymphadenopathy:    She has no cervical adenopathy.  Neurological: She is alert and oriented to person, place, and time.  Skin: Skin is warm and dry. No rash noted. She is not diaphoretic. No erythema. No pallor.  Psychiatric: She has a normal mood and affect. Her behavior is normal. Judgment and thought content normal.  Nursing note and vitals reviewed.     Assessment & Plan:  1. Viral sore throat - Likely viral. Will send in magic mouth wash. She can take tylenol or motrin for symptom relief. Say well hydrated. Will refill Flonase for seasonal allergies - fluticasone (FLONASE) 50 MCG/ACT nasal spray; Place 2 sprays into both nostrils daily.  Dispense: 16  g; Refill: 6 - magic mouthwash w/lidocaine SOLN; Take 5 mLs by mouth 3 (three) times daily as needed.  Dispense: 180 mL; Refill: 0 - POC Rapid Strep A- Negative - Follow up if no improvement in the next 2-3 days   Dorothyann Peng, NP

## 2016-05-28 ENCOUNTER — Other Ambulatory Visit: Payer: Self-pay | Admitting: Family

## 2016-05-28 MED ORDER — BUDESONIDE-FORMOTEROL FUMARATE 160-4.5 MCG/ACT IN AERO: 2.0000 | INHALATION_SPRAY | Freq: Two times a day (BID) | RESPIRATORY_TRACT | 3 refills | Status: DC

## 2016-07-14 ENCOUNTER — Encounter: Payer: Self-pay | Admitting: Family

## 2016-07-14 ENCOUNTER — Ambulatory Visit: Payer: BLUE CROSS/BLUE SHIELD | Admitting: Family

## 2016-07-14 ENCOUNTER — Other Ambulatory Visit (INDEPENDENT_AMBULATORY_CARE_PROVIDER_SITE_OTHER): Payer: BLUE CROSS/BLUE SHIELD

## 2016-07-14 ENCOUNTER — Ambulatory Visit (INDEPENDENT_AMBULATORY_CARE_PROVIDER_SITE_OTHER): Payer: BLUE CROSS/BLUE SHIELD | Admitting: Family

## 2016-07-14 VITALS — BP 108/72 | HR 65 | Temp 98.2°F | Resp 16 | Ht 61.5 in | Wt 149.0 lb

## 2016-07-14 DIAGNOSIS — E039 Hypothyroidism, unspecified: Secondary | ICD-10-CM

## 2016-07-14 DIAGNOSIS — R5383 Other fatigue: Secondary | ICD-10-CM

## 2016-07-14 LAB — VITAMIN B12: Vitamin B-12: 1333 pg/mL — ABNORMAL HIGH (ref 211–911)

## 2016-07-14 LAB — IBC PANEL
Iron: 99 ug/dL (ref 42–145)
Saturation Ratios: 30.2 % (ref 20.0–50.0)
TRANSFERRIN: 234 mg/dL (ref 212.0–360.0)

## 2016-07-14 LAB — CBC
HCT: 40.4 % (ref 36.0–46.0)
HEMOGLOBIN: 14.1 g/dL (ref 12.0–15.0)
MCHC: 34.8 g/dL (ref 30.0–36.0)
MCV: 89.2 fl (ref 78.0–100.0)
Platelets: 325 10*3/uL (ref 150.0–400.0)
RBC: 4.53 Mil/uL (ref 3.87–5.11)
RDW: 12.9 % (ref 11.5–15.5)
WBC: 6.1 10*3/uL (ref 4.0–10.5)

## 2016-07-14 LAB — TSH: TSH: 1.4 u[IU]/mL (ref 0.35–4.50)

## 2016-07-14 NOTE — Progress Notes (Signed)
Subjective:    Patient ID: Kristi Hendrix, female    DOB: 07/12/1965, 51 y.o.   MRN: [id number]  Chief Complaint  Patient presents with  . Follow-up    TSH check    HPI:  Kristi Hendrix is a 51 y.o. female who  has a past medical history of Asthma; Chicken pox; Detached retina; Headache; History of bronchitis; Hypothyroidism; Seasonal allergies; SVD (spontaneous vaginal delivery); and UTI (lower urinary tract infection). and presents today for a follow up office visit.   1.) Hypothyroidism/fatigue  - Currently maintained on levothyroxine. Reports taking the medication as prescribed and denies adverse side effects. Notes that she is fatigued and all that she wants to do is sleep. Sleeping about 3-4 hours per night. Describes a grogginess during the day. Denies shortness of breath, sore throat or abdominal pains. Indicates that she has had increased stress recently. Continues to experience hot flashes.   Lab Results  Component Value Date   TSH 1.40 07/14/2016      Allergies  Allergen Reactions  . Aspirin     Makes pt sleepy  . Codeine Itching  . Zofran [Ondansetron Hcl] Hives      Outpatient Medications Prior to Visit  Medication Sig Dispense Refill  . albuterol (PROVENTIL HFA;VENTOLIN HFA) 108 (90 BASE) MCG/ACT inhaler Inhale 1-2 puffs into the lungs every 6 (six) hours as needed. Shortness of breath 1 Inhaler 3  . albuterol (PROVENTIL) (2.5 MG/3ML) 0.083% nebulizer solution 1 vial every 4-6 hours as needed 30 vial 6  . budesonide-formoterol (SYMBICORT) 160-4.5 MCG/ACT inhaler Inhale 2 puffs into the lungs 2 (two) times daily. 1 Inhaler 3  . cycloSPORINE (RESTASIS) 0.05 % ophthalmic emulsion Place 1 drop into both eyes 2 (two) times daily.    . fluticasone (FLONASE) 50 MCG/ACT nasal spray Place 2 sprays into both nostrils daily. 16 g 6  . levothyroxine (SYNTHROID, LEVOTHROID) 50 MCG tablet TAKE 1 TABLET BY MOUTH EVERY DAY BEFORE BREAKFAST 90 tablet 2  .  mometasone-formoterol (DULERA) 100-5 MCG/ACT AERO Inhale 2 puffs into the lungs 2 (two) times daily. 13 g 11  . montelukast (SINGULAIR) 10 MG tablet TAKE 1 TABLET BY MOUTH AT BEDTIME 90 tablet 1  . magic mouthwash w/lidocaine SOLN Take 5 mLs by mouth 3 (three) times daily as needed. 180 mL 0   No facility-administered medications prior to visit.       Review of Systems  Constitutional: Positive for fatigue. Negative for chills and fever.  Respiratory: Negative for chest tightness and shortness of breath.   Cardiovascular: Negative for chest pain, palpitations and leg swelling.      Objective:    BP 108/72 (BP Location: Left Arm, Patient Position: Sitting, Cuff Size: Normal)   Pulse 65   Temp 98.2 F (36.8 C) (Oral)   Resp 16   Ht 5' 1.5" (1.562 m)   Wt 149 lb (67.6 kg)   LMP 11/11/2014 (Approximate)   SpO2 98%   BMI 27.70 kg/m  Nursing note and vital signs reviewed.  Physical Exam  Constitutional: She is oriented to person, place, and time. She appears well-developed and well-nourished. No distress.  Neck: Neck supple. No thyromegaly present.  Cardiovascular: Normal rate, regular rhythm, normal heart sounds and intact distal pulses.   Pulmonary/Chest: Effort normal and breath sounds normal. No respiratory distress. She has no wheezes. She has no rales. She exhibits no tenderness.  Neurological: She is alert and oriented to person, place, and time.  Skin: Skin is warm  and dry.  Psychiatric: She has a normal mood and affect. Her behavior is normal. Judgment and thought content normal.       Assessment & Plan:   Problem List Items Addressed This Visit      Endocrine   Hypothyroidism - Primary    Currently maintained on levothyroxine with no adverse side effects with continued fatigue. Obtain TSH. Continue current dosage of levothyroxine pending TSH results.      Relevant Orders   TSH (Completed)     Other   Fatigue    Continues to experience fatigue with previous  blood work being normal approximately a year and a half ago. Obtain B12, CBC, IBC panel, and TSH to rule out metabolic causes. No evidence of sleep apnea. Cannot rule out underlying depression or stress as she discusses she has significant family issues going on presently. Also cannot rule out underlying cardiovascular disease. Continue to monitor and follow-up pending blood work results.      Relevant Orders   B12 (Completed)   IBC panel (Completed)   CBC (Completed)       I have discontinued Ms. Ruby's magic mouthwash w/lidocaine. I am also having her maintain her albuterol, albuterol, cycloSPORINE, mometasone-formoterol, montelukast, levothyroxine, fluticasone, and budesonide-formoterol.   Follow-up: Return in about 3 months (around 10/12/2016), or if symptoms worsen or fail to improve.  Mauricio Po, FNP

## 2016-07-14 NOTE — Assessment & Plan Note (Signed)
Currently maintained on levothyroxine with no adverse side effects with continued fatigue. Obtain TSH. Continue current dosage of levothyroxine pending TSH results.

## 2016-07-14 NOTE — Assessment & Plan Note (Signed)
Continues to experience fatigue with previous blood work being normal approximately a year and a half ago. Obtain B12, CBC, IBC panel, and TSH to rule out metabolic causes. No evidence of sleep apnea. Cannot rule out underlying depression or stress as she discusses she has significant family issues going on presently. Also cannot rule out underlying cardiovascular disease. Continue to monitor and follow-up pending blood work results.

## 2016-07-14 NOTE — Patient Instructions (Addendum)
Thank you for choosing [facility].  SUMMARY AND INSTRUCTIONS:  Medication:  Please continue to take your medications as prescribed.  Labs:  Please stop by the lab on the lower level of the building for your blood work. Your results will be released to [place] (or called to you) after review, usually within 72 hours after test completion. If any changes need to be made, you will be notified at that same time.  1.) The lab is open from 7:30am to 5:30 pm Monday-Friday 2.) No appointment is necessary 3.) Fasting (if needed) is 6-8 hours after food and drink; black coffee and water are okay   Follow up:  If your symptoms worsen or fail to improve, please contact our office for further instruction, or in case of emergency go directly to the emergency room at the closest medical facility.

## 2016-09-13 ENCOUNTER — Other Ambulatory Visit: Payer: Self-pay | Admitting: Adult Health

## 2016-09-13 DIAGNOSIS — B9789 Other viral agents as the cause of diseases classified elsewhere: Secondary | ICD-10-CM

## 2016-09-13 DIAGNOSIS — J028 Acute pharyngitis due to other specified organisms: Principal | ICD-10-CM

## 2016-09-16 NOTE — Telephone Encounter (Signed)
[facility] patient.

## 2016-11-06 ENCOUNTER — Encounter: Payer: Self-pay | Admitting: Nurse Practitioner

## 2016-11-06 ENCOUNTER — Ambulatory Visit (INDEPENDENT_AMBULATORY_CARE_PROVIDER_SITE_OTHER): Payer: BLUE CROSS/BLUE SHIELD | Admitting: Nurse Practitioner

## 2016-11-06 VITALS — BP 106/74 | HR 79 | Temp 98.2°F | Ht 61.5 in | Wt 147.0 lb

## 2016-11-06 DIAGNOSIS — J45901 Unspecified asthma with (acute) exacerbation: Secondary | ICD-10-CM | POA: Diagnosis not present

## 2016-11-06 DIAGNOSIS — J0140 Acute pansinusitis, unspecified: Secondary | ICD-10-CM | POA: Diagnosis not present

## 2016-11-06 MED ORDER — METHYLPREDNISOLONE ACETATE 80 MG/ML IJ SUSP
80.0000 mg | Freq: Once | INTRAMUSCULAR | Status: AC
  Administered 2016-11-06: 80 mg via INTRAMUSCULAR

## 2016-11-06 MED ORDER — AMOXICILLIN-POT CLAVULANATE 875-125 MG PO TABS: 1.0000 | ORAL_TABLET | Freq: Two times a day (BID) | ORAL | 0 refills | Status: DC

## 2016-11-06 MED ORDER — GUAIFENESIN ER 600 MG PO TB12: 600.0000 mg | ORAL_TABLET | Freq: Two times a day (BID) | ORAL | 0 refills | Status: DC | PRN

## 2016-11-06 MED ORDER — BENZONATATE 100 MG PO CAPS
100.0000 mg | ORAL_CAPSULE | Freq: Three times a day (TID) | ORAL | 0 refills | Status: DC | PRN
Start: 2016-11-06 — End: 2017-02-10

## 2016-11-06 NOTE — Progress Notes (Signed)
Pre visit review using our clinic review tool, if applicable. No additional management support is needed unless otherwise documented below in the visit note. 

## 2016-11-06 NOTE — Progress Notes (Signed)
Subjective:  Patient ID: Kristi Hendrix, female    DOB: [birth year]  Age: 51 y.o. MRN: [id number]  CC: Nasal Congestion (congestion,green mucus from cough,head, right ear stop up for 1 wk. )   Sinusitis  This is a new problem. The current episode started in the past 7 days. The problem has been rapidly worsening since onset. There has been no fever. Associated symptoms include chills, congestion, coughing, ear pain, headaches, a hoarse voice, sinus pressure, a sore throat and swollen glands. Pertinent negatives include no shortness of breath. Past treatments include acetaminophen and oral decongestants. The treatment provided no relief.    Outpatient Medications Prior to Visit  Medication Sig Dispense Refill  . albuterol (PROVENTIL HFA;VENTOLIN HFA) 108 (90 BASE) MCG/ACT inhaler Inhale 1-2 puffs into the lungs every 6 (six) hours as needed. Shortness of breath 1 Inhaler 3  . albuterol (PROVENTIL) (2.5 MG/3ML) 0.083% nebulizer solution 1 vial every 4-6 hours as needed 30 vial 6  . budesonide-formoterol (SYMBICORT) 160-4.5 MCG/ACT inhaler Inhale 2 puffs into the lungs 2 (two) times daily. 1 Inhaler 3  . cycloSPORINE (RESTASIS) 0.05 % ophthalmic emulsion Place 1 drop into both eyes 2 (two) times daily.    . fluticasone (FLONASE) 50 MCG/ACT nasal spray PLACE 2 SPRAYS INTO BOTH NOSTRILS DAILY. 16 g 2  . levothyroxine (SYNTHROID, LEVOTHROID) 50 MCG tablet TAKE 1 TABLET BY MOUTH EVERY DAY BEFORE BREAKFAST 90 tablet 2  . montelukast (SINGULAIR) 10 MG tablet TAKE 1 TABLET BY MOUTH AT BEDTIME 90 tablet 1  . mometasone-formoterol (DULERA) 100-5 MCG/ACT AERO Inhale 2 puffs into the lungs 2 (two) times daily. 13 g 11   No facility-administered medications prior to visit.     ROS See HPI  Objective:  BP 106/74   Pulse 79   Temp 98.2 F (36.8 C)   Ht 5' 1.5" (1.562 m)   Wt 147 lb (66.7 kg)   LMP 11/11/2014 (Approximate)   SpO2 99%   BMI 27.33 kg/m   BP Readings from Last 3 Encounters:    11/06/16 106/74  07/14/16 108/72  04/23/16 124/70    Wt Readings from Last 3 Encounters:  11/06/16 147 lb (66.7 kg)  07/14/16 149 lb (67.6 kg)  04/23/16 151 lb 12.8 oz (68.9 kg)    Physical Exam  Constitutional: She is oriented to person, place, and time.  HENT:  Right Ear: Tympanic membrane, external ear and ear canal normal.  Left Ear: Tympanic membrane, external ear and ear canal normal.  Nose: Mucosal edema and rhinorrhea present. Right sinus exhibits maxillary sinus tenderness. Right sinus exhibits no frontal sinus tenderness. Left sinus exhibits maxillary sinus tenderness. Left sinus exhibits no frontal sinus tenderness.  Mouth/Throat: Uvula is midline. No trismus in the jaw. Posterior oropharyngeal erythema present. No oropharyngeal exudate.  Eyes: No scleral icterus.  Neck: Normal range of motion. Neck supple.  Cardiovascular: Normal rate and normal heart sounds.   Pulmonary/Chest: Effort normal and breath sounds normal.  Musculoskeletal: She exhibits no edema.  Lymphadenopathy:    She has cervical adenopathy.  Neurological: She is alert and oriented to person, place, and time.  Vitals reviewed.   Lab Results  Component Value Date   WBC 6.1 07/14/2016   HGB 14.1 07/14/2016   HCT 40.4 07/14/2016   PLT 325.0 07/14/2016   GLUCOSE 90 05/03/2015   CHOL 173 02/20/2016   TRIG 79.0 02/20/2016   HDL 63.30 02/20/2016   LDLCALC 94 02/20/2016   ALT 17 05/03/2015   AST 20  05/03/2015   NA 141 05/03/2015   K 4.6 05/03/2015   CL 107 05/03/2015   CREATININE 0.98 05/03/2015   BUN 7 05/03/2015   CO2 25 05/03/2015   TSH 1.40 07/14/2016    Ct Head Wo Contrast  Result Date: 03/05/2016 CLINICAL DATA:  Severe tension headaches for several weeks EXAM: CT HEAD WITHOUT CONTRAST TECHNIQUE: Contiguous axial images were obtained from the base of the skull through the vertex without intravenous contrast. COMPARISON:  None. FINDINGS: Brain: The ventricular system is normal in size and  configuration, and the septum is in a normal midline position. The fourth ventricle and basilar cisterns are unremarkable. No hemorrhage, mass lesion, or acute infarction is seen. Vascular: On this unenhanced study, no vascular abnormality is evident. Skull: No calvarial abnormality is seen. Sinuses/Orbits: The paranasal sinuses that are visualized are well pneumatized. Other: None IMPRESSION: Negative unenhanced CT of the brain. Electronically Signed   By: Ivar Drape M.D.   On: 03/05/2016 13:45    Assessment & Plan:   Stamatia was seen today for nasal congestion.  Diagnoses and all orders for this visit:  Acute non-recurrent pansinusitis -     methylPREDNISolone acetate (DEPO-MEDROL) injection 80 mg; Inject 1 mL (80 mg total) into the muscle once. -     guaiFENesin (MUCINEX) 600 MG 12 hr tablet; Take 1 tablet (600 mg total) by mouth 2 (two) times daily as needed for cough or to loosen phlegm. -     amoxicillin-clavulanate (AUGMENTIN) 875-125 MG tablet; Take 1 tablet by mouth 2 (two) times daily. -     benzonatate (TESSALON) 100 MG capsule; Take 1 capsule (100 mg total) by mouth 3 (three) times daily as needed for cough.  Asthma with acute exacerbation, unspecified asthma severity, unspecified whether persistent -     methylPREDNISolone acetate (DEPO-MEDROL) injection 80 mg; Inject 1 mL (80 mg total) into the muscle once. -     guaiFENesin (MUCINEX) 600 MG 12 hr tablet; Take 1 tablet (600 mg total) by mouth 2 (two) times daily as needed for cough or to loosen phlegm. -     benzonatate (TESSALON) 100 MG capsule; Take 1 capsule (100 mg total) by mouth 3 (three) times daily as needed for cough.   I have discontinued Ms. Faison's mometasone-formoterol. I am also having her start on guaiFENesin, amoxicillin-clavulanate, and benzonatate. Additionally, I am having her maintain her albuterol, albuterol, cycloSPORINE, montelukast, levothyroxine, budesonide-formoterol, and fluticasone. We administered  methylPREDNISolone acetate.  Meds ordered this encounter  Medications  . methylPREDNISolone acetate (DEPO-MEDROL) injection 80 mg  . guaiFENesin (MUCINEX) 600 MG 12 hr tablet    Sig: Take 1 tablet (600 mg total) by mouth 2 (two) times daily as needed for cough or to loosen phlegm.    Dispense:  14 tablet    Refill:  0    Order Specific Question:   Supervising Provider    Answer:   Cassandria Anger [[id number]]  . amoxicillin-clavulanate (AUGMENTIN) 875-125 MG tablet    Sig: Take 1 tablet by mouth 2 (two) times daily.    Dispense:  14 tablet    Refill:  0    Order Specific Question:   Supervising Provider    Answer:   Cassandria Anger [[id number]]  . benzonatate (TESSALON) 100 MG capsule    Sig: Take 1 capsule (100 mg total) by mouth 3 (three) times daily as needed for cough.    Dispense:  20 capsule    Refill:  0  Order Specific Question:   Supervising Provider    Answer:   Cassandria Anger [[id number]]    Follow-up: Return if symptoms worsen or fail to improve.  Wilfred Lacy, NP

## 2016-11-06 NOTE — Patient Instructions (Signed)
Encourage adequate oral hydration. Continue inhalers and singulair as prescribed

## 2016-12-05 ENCOUNTER — Other Ambulatory Visit: Payer: Self-pay | Admitting: Family

## 2016-12-05 DIAGNOSIS — E039 Hypothyroidism, unspecified: Secondary | ICD-10-CM

## 2016-12-12 ENCOUNTER — Encounter: Payer: Self-pay | Admitting: Family

## 2016-12-12 ENCOUNTER — Other Ambulatory Visit (INDEPENDENT_AMBULATORY_CARE_PROVIDER_SITE_OTHER): Payer: BLUE CROSS/BLUE SHIELD

## 2016-12-12 DIAGNOSIS — E039 Hypothyroidism, unspecified: Secondary | ICD-10-CM

## 2016-12-12 LAB — TSH: TSH: 1.01 u[IU]/mL (ref 0.35–4.50)

## 2017-01-14 ENCOUNTER — Other Ambulatory Visit: Payer: Self-pay | Admitting: Family

## 2017-02-10 ENCOUNTER — Encounter: Payer: Self-pay | Admitting: Family

## 2017-02-10 ENCOUNTER — Ambulatory Visit (INDEPENDENT_AMBULATORY_CARE_PROVIDER_SITE_OTHER): Payer: BLUE CROSS/BLUE SHIELD | Admitting: Family

## 2017-02-10 DIAGNOSIS — T7840XA Allergy, unspecified, initial encounter: Secondary | ICD-10-CM

## 2017-02-10 MED ORDER — HYDROCORTISONE 2.5 % EX CREA: TOPICAL_CREAM | Freq: Two times a day (BID) | CUTANEOUS | 0 refills | Status: AC

## 2017-02-10 MED ORDER — PREDNISONE 20 MG PO TABS: ORAL_TABLET | ORAL | 0 refills | Status: DC

## 2017-02-10 NOTE — Progress Notes (Signed)
Subjective:    Patient ID: Kristi Hendrix, female    DOB: 1966-05-06, 51 y.o.   MRN: [id number]  Chief Complaint  Patient presents with  . Rash    noticed on sunday that she felt a sting in her face and started scratching and has a spreaded into a rash    HPI:  Kristi Hendrix is a 51 y.o. female who  has a past medical history of Asthma; Chicken pox; Detached retina; Headache; History of bronchitis; Hypothyroidism; Seasonal allergies; SVD (spontaneous vaginal delivery); and UTI (lower urinary tract infection). and presents today for an acute office visit.  This is a new problem. Associated symptom of a rash located on her face and neck has been going on for about 3 days following a feeling like a bee sting on the side of her left cheek. Modifying factors include Benadryl and benadryl cream which have not helped very much as she continues to experience significant amount of itching. Denies shortness of breath, respiratory distress or tongue swelling.   Allergies  Allergen Reactions  . Aspirin     Makes pt sleepy  . Codeine Itching  . Zofran [Ondansetron Hcl] Hives      Outpatient Medications Prior to Visit  Medication Sig Dispense Refill  . albuterol (PROVENTIL HFA;VENTOLIN HFA) 108 (90 BASE) MCG/ACT inhaler Inhale 1-2 puffs into the lungs every 6 (six) hours as needed. Shortness of breath 1 Inhaler 3  . albuterol (PROVENTIL) (2.5 MG/3ML) 0.083% nebulizer solution 1 vial every 4-6 hours as needed 30 vial 6  . budesonide-formoterol (SYMBICORT) 160-4.5 MCG/ACT inhaler Inhale 2 puffs into the lungs 2 (two) times daily. 1 Inhaler 3  . cycloSPORINE (RESTASIS) 0.05 % ophthalmic emulsion Place 1 drop into both eyes 2 (two) times daily.    . fluticasone (FLONASE) 50 MCG/ACT nasal spray PLACE 2 SPRAYS INTO BOTH NOSTRILS DAILY. 16 g 2  . guaiFENesin (MUCINEX) 600 MG 12 hr tablet Take 1 tablet (600 mg total) by mouth 2 (two) times daily as needed for cough or to loosen phlegm. 14 tablet  0  . levothyroxine (SYNTHROID, LEVOTHROID) 50 MCG tablet TAKE 1 TABLET BY MOUTH EVERY DAY BEFORE BREAKFAST 90 tablet 1  . montelukast (SINGULAIR) 10 MG tablet TAKE 1 TABLET BY MOUTH AT BEDTIME 90 tablet 1  . amoxicillin-clavulanate (AUGMENTIN) 875-125 MG tablet Take 1 tablet by mouth 2 (two) times daily. 14 tablet 0  . benzonatate (TESSALON) 100 MG capsule Take 1 capsule (100 mg total) by mouth 3 (three) times daily as needed for cough. 20 capsule 0   No facility-administered medications prior to visit.       Past Medical History:  Diagnosis Date  . Asthma   . Chicken pox   . Detached retina    left eye, will have surgery to repair after recovering from GYN surgery  . Headache    otc med prn  . History of bronchitis   . Hypothyroidism   . Seasonal allergies   . SVD (spontaneous vaginal delivery)    x 2  . UTI (lower urinary tract infection)       Review of Systems  Constitutional: Negative for chills and fever.  Respiratory: Negative for apnea, cough, choking, chest tightness, shortness of breath, wheezing and stridor.   Skin: Positive for rash.      Objective:    BP 136/82 (BP Location: Right Arm, Patient Position: Sitting, Cuff Size: Normal)   Pulse 86   Temp 98.8 F (37.1 C) (Oral)  Resp 16   Ht 5' 1.5" (1.562 m)   Wt 150 lb 12.8 oz (68.4 kg)   LMP 11/11/2014 (Approximate)   SpO2 98%   BMI 28.03 kg/m  Nursing note and vital signs reviewed.  Physical Exam  Constitutional: She is oriented to person, place, and time. She appears well-developed and well-nourished. No distress.  Cardiovascular: Normal rate, regular rhythm, normal heart sounds and intact distal pulses.   Pulmonary/Chest: Effort normal and breath sounds normal.  Neurological: She is alert and oriented to person, place, and time.  Skin: Skin is warm and dry.  Red, raised rash that appear consistent with wheals located on her left check and left aspect of her neck.   Psychiatric: She has a normal  mood and affect. Her behavior is normal. Judgment and thought content normal.       Assessment & Plan:   Problem List Items Addressed This Visit      Other   Allergic reaction    Allergic reaction of undetermined origin with no respiratory distress or anaphylaxis. Start prednisone taper and hydrocortisone cream. Continue over-the-counter medications as needed for symptom relief and supportive care. Follow-up if symptoms worsen or do not improve.          I have discontinued Ms. Morison's amoxicillin-clavulanate and benzonatate. I am also having her start on predniSONE and hydrocortisone. Additionally, I am having her maintain her albuterol, albuterol, cycloSPORINE, montelukast, budesonide-formoterol, fluticasone, guaiFENesin, and levothyroxine.   Meds ordered this encounter  Medications  . predniSONE (DELTASONE) 20 MG tablet    Sig: Take 3 tablets by mouth daily for 3 days, 2 tablets by mouth daily for 3 days, then 1 tablet by mouth daily for 3 days.    Dispense:  18 tablet    Refill:  0    Order Specific Question:   Supervising Provider    Answer:   Pricilla Holm A [[id number]]  . hydrocortisone 2.5 % cream    Sig: Apply topically 2 (two) times daily.    Dispense:  30 g    Refill:  0    Order Specific Question:   Supervising Provider    Answer:   Pricilla Holm A [[id number]]     Follow-up: Return if symptoms worsen or fail to improve.  Mauricio Po, FNP

## 2017-02-10 NOTE — Patient Instructions (Signed)
Thank you for choosing [facility].  SUMMARY AND INSTRUCTIONS:  Please start the prednisone taper.  Hydrocortisone cream as needed.   Medication:  Your prescription(s) have been submitted to your pharmacy or been printed and provided for you. Please take as directed and contact our office if you believe you are having problem(s) with the medication(s) or have any questions.   Follow up:  If your symptoms worsen or fail to improve, please contact our office for further instruction, or in case of emergency go directly to the emergency room at the closest medical facility.

## 2017-02-10 NOTE — Assessment & Plan Note (Signed)
Allergic reaction of undetermined origin with no respiratory distress or anaphylaxis. Start prednisone taper and hydrocortisone cream. Continue over-the-counter medications as needed for symptom relief and supportive care. Follow-up if symptoms worsen or do not improve.

## 2017-06-22 ENCOUNTER — Other Ambulatory Visit: Payer: Self-pay | Admitting: Family

## 2017-07-21 ENCOUNTER — Telehealth: Payer: Self-pay | Admitting: Family

## 2017-07-21 NOTE — Telephone Encounter (Signed)
Copied from [place] [id number]. Topic: General - Other >> Jul 21, 2017  1:50 PM Carolyn Stare wrote:  Pt was a [facility] pt and will transfer to [facility] in April. She need a refill on the below med. She has not seen the doctor in a year do she need to follow up with someone or will they refill. Pt would like a call back [phone]   levothyroxine (SYNTHROID, LEVOTHROID) 50 MCG tablet

## 2017-07-21 NOTE — Telephone Encounter (Signed)
Appt made with Kristi Hendrix on 1-16

## 2017-07-21 NOTE — Telephone Encounter (Signed)
Pt need to make appt w/ any provider. She need TSH levels check prior to giving refills. She has not seen Greg since 07/2016. Pls call pt to make appt for med renewal.../lmb

## 2017-07-22 ENCOUNTER — Ambulatory Visit: Payer: BLUE CROSS/BLUE SHIELD | Admitting: Family

## 2017-07-22 ENCOUNTER — Other Ambulatory Visit (INDEPENDENT_AMBULATORY_CARE_PROVIDER_SITE_OTHER): Payer: BLUE CROSS/BLUE SHIELD

## 2017-07-22 ENCOUNTER — Encounter: Payer: Self-pay | Admitting: Family

## 2017-07-22 VITALS — BP 118/80 | HR 76 | Temp 99.0°F | Ht 61.0 in | Wt 161.1 lb

## 2017-07-22 DIAGNOSIS — E039 Hypothyroidism, unspecified: Secondary | ICD-10-CM

## 2017-07-22 DIAGNOSIS — J45909 Unspecified asthma, uncomplicated: Secondary | ICD-10-CM | POA: Diagnosis not present

## 2017-07-22 LAB — TSH: TSH: 2.25 u[IU]/mL (ref 0.35–4.50)

## 2017-07-22 MED ORDER — MONTELUKAST SODIUM 10 MG PO TABS: 10.0000 mg | ORAL_TABLET | Freq: Every day | ORAL | 1 refills | Status: DC

## 2017-07-22 MED ORDER — BUDESONIDE-FORMOTEROL FUMARATE 160-4.5 MCG/ACT IN AERO: 2.0000 | INHALATION_SPRAY | Freq: Two times a day (BID) | RESPIRATORY_TRACT | 1 refills | Status: DC

## 2017-07-22 NOTE — Progress Notes (Signed)
Kristi Hendrix is a 52 y.o. female with the following history as recorded in Epic[employer]:  Patient Active Problem List   Diagnosis Date Noted  . Allergic reaction 02/10/2017  . Headache 02/20/2016  . Routine history and physical examination of adult 02/20/2016  . Viral URI with cough 06/20/2015  . Seasonal allergies 03/24/2015  . Laboratory exam ordered as part of routine general medical examination 03/13/2015  . Fatigue 03/13/2015  . S/P vaginal hysterectomy 11/27/2014  . Asthma 10/16/2014  . Hypothyroidism 10/16/2014    Current Outpatient Medications  Medication Sig Dispense Refill  . albuterol (PROVENTIL HFA;VENTOLIN HFA) 108 (90 BASE) MCG/ACT inhaler Inhale 1-2 puffs into the lungs every 6 (six) hours as needed. Shortness of breath 1 Inhaler 3  . albuterol (PROVENTIL) (2.5 MG/3ML) 0.083% nebulizer solution 1 vial every 4-6 hours as needed 30 vial 6  . budesonide-formoterol (SYMBICORT) 160-4.5 MCG/ACT inhaler Inhale 2 puffs into the lungs 2 (two) times daily. 1 Inhaler 1  . cycloSPORINE (RESTASIS) 0.05 % ophthalmic emulsion Place 1 drop into both eyes 2 (two) times daily.    . fluticasone (FLONASE) 50 MCG/ACT nasal spray PLACE 2 SPRAYS INTO BOTH NOSTRILS DAILY. 16 g 2  . guaiFENesin (MUCINEX) 600 MG 12 hr tablet Take 1 tablet (600 mg total) by mouth 2 (two) times daily as needed for cough or to loosen phlegm. 14 tablet 0  . hydrocortisone 2.5 % cream Apply topically 2 (two) times daily. 30 g 0  . levothyroxine (SYNTHROID, LEVOTHROID) 50 MCG tablet TAKE 1 TABLET BY MOUTH EVERY DAY BEFORE BREAKFAST 90 tablet 1  . montelukast (SINGULAIR) 10 MG tablet Take 1 tablet (10 mg total) by mouth at bedtime. 90 tablet 1  . predniSONE (DELTASONE) 20 MG tablet Take 3 tablets by mouth daily for 3 days, 2 tablets by mouth daily for 3 days, then 1 tablet by mouth daily for 3 days. 18 tablet 0   No current facility-administered medications for this visit.     Allergies: Aspirin; Codeine; and Zofran  [ondansetron hcl]  Past Medical History:  Diagnosis Date  . Asthma   . Chicken pox   . Detached retina    left eye, will have surgery to repair after recovering from GYN surgery  . Headache    otc med prn  . History of bronchitis   . Hypothyroidism   . Seasonal allergies   . SVD (spontaneous vaginal delivery)    x 2  . UTI (lower urinary tract infection)     Past Surgical History:  Procedure Laterality Date  . ELBOW SURGERY     right  . TUBAL LIGATION    . VAGINAL HYSTERECTOMY N/A 11/27/2014   Procedure: HYSTERECTOMY VAGINAL;  Surgeon: Newton Pigg, MD;  Location: [facility];  Service: Gynecology;  Laterality: N/A;    Family History  Problem Relation Age of Onset  . Alcohol abuse Mother   . Alcohol abuse Father   . Diabetes Father   . Heart disease Maternal Grandfather     Social History   Tobacco Use  . Smoking status: Never Smoker  . Smokeless tobacco: Never Used  Substance Use Topics  . Alcohol use: Yes    Comment: Social drinker    Subjective:  Follow-up on hypothyroidism; needs to get labs updated today; admits she is more tired recently but feels it is job related; working 13-14 hours/ day; Also needs refills on her Symbicort and Singulair; uses in the springtime when allergies cause more bronchial symptoms;  Will be  coming back to see Ashleigh in April to establish with new PCP here/ update CPE;   Objective:  Vitals:   07/22/17 1605  BP: 118/80  Pulse: 76  Temp: 99 F (37.2 C)  TempSrc: Oral  SpO2: 99%  Weight: 161 lb 1.9 oz (73.1 kg)  Height: 5\' 1"  (1.549 m)    General: Well developed, well nourished, in no acute distress  Skin : Warm and dry.  Head: Normocephalic and atraumatic  Eyes: Sclera and conjunctiva clear; pupils round and reactive to light; extraocular movements intact  Ears: External normal; canals clear; tympanic membranes normal  Oropharynx: Pink, supple. No suspicious lesions  Neck: Supple without thyromegaly, adenopathy  Lungs:  Respirations unlabored; clear to auscultation bilaterally without wheeze, rales, rhonchi  CVS exam: normal rate and regular rhythm.  Neurologic: Alert and oriented; speech intact; face symmetrical; moves all extremities well; CNII-XII intact without focal deficit   Assessment:  1. Hypothyroidism, unspecified type   2. Asthma, unspecified asthma severity, unspecified whether complicated, unspecified whether persistent     Plan:  1. Check TSH today; will adjust and refill Synthroid accordingly; 2. Stable; refill updated on Symbicort and Singulair;  Keep planned follow-up with new PCP for April.   No Follow-up on file.  Orders Placed This Encounter  Procedures  . TSH    Standing Status:   Future    Number of Occurrences:   1    Standing Expiration Date:   07/22/2018    Requested Prescriptions   Signed Prescriptions Disp Refills  . budesonide-formoterol (SYMBICORT) 160-4.5 MCG/ACT inhaler 1 Inhaler 1    Sig: Inhale 2 puffs into the lungs 2 (two) times daily.  . montelukast (SINGULAIR) 10 MG tablet 90 tablet 1    Sig: Take 1 tablet (10 mg total) by mouth at bedtime.

## 2017-07-24 ENCOUNTER — Other Ambulatory Visit: Payer: Self-pay | Admitting: Family

## 2017-07-24 MED ORDER — LEVOTHYROXINE SODIUM 50 MCG PO TABS: ORAL_TABLET | ORAL | 1 refills | Status: DC

## 2017-10-14 ENCOUNTER — Telehealth: Payer: Self-pay | Admitting: Family

## 2017-10-14 ENCOUNTER — Other Ambulatory Visit: Payer: Self-pay | Admitting: Family

## 2017-10-14 MED ORDER — LEVOTHYROXINE SODIUM 50 MCG PO TABS: ORAL_TABLET | ORAL | 1 refills | Status: DC

## 2017-10-14 NOTE — Telephone Encounter (Signed)
Spoke with patient today and she knows script has been faxed into pharmacy of her request.

## 2017-10-14 NOTE — Telephone Encounter (Signed)
Please advise. Thanks.  

## 2017-10-14 NOTE — Telephone Encounter (Signed)
Sent to pharmacy on file as requested;

## 2017-10-14 NOTE — Telephone Encounter (Signed)
Pt would like a refill of her Synthroid, she has one refill at [facility] but she no longer uses that pharmacy,  She would like it sent to the pharmacy in her chart.  Please advise

## 2017-10-20 ENCOUNTER — Ambulatory Visit: Payer: BLUE CROSS/BLUE SHIELD | Admitting: Nurse Practitioner

## 2017-10-21 ENCOUNTER — Ambulatory Visit: Payer: BLUE CROSS/BLUE SHIELD | Admitting: Family

## 2017-10-21 ENCOUNTER — Encounter: Payer: Self-pay | Admitting: Family

## 2017-10-21 VITALS — BP 110/80 | HR 75 | Temp 98.0°F | Ht 61.0 in | Wt 144.1 lb

## 2017-10-21 DIAGNOSIS — E039 Hypothyroidism, unspecified: Secondary | ICD-10-CM

## 2017-10-21 DIAGNOSIS — Z13220 Encounter for screening for lipoid disorders: Secondary | ICD-10-CM | POA: Diagnosis not present

## 2017-10-21 DIAGNOSIS — R5383 Other fatigue: Secondary | ICD-10-CM | POA: Diagnosis not present

## 2017-10-21 DIAGNOSIS — Z0000 Encounter for general adult medical examination without abnormal findings: Secondary | ICD-10-CM | POA: Diagnosis not present

## 2017-10-21 MED ORDER — BUDESONIDE-FORMOTEROL FUMARATE 160-4.5 MCG/ACT IN AERO: 2.0000 | INHALATION_SPRAY | Freq: Two times a day (BID) | RESPIRATORY_TRACT | 3 refills | Status: DC

## 2017-10-21 MED ORDER — ALBUTEROL SULFATE HFA 108 (90 BASE) MCG/ACT IN AERS: 1.0000 | INHALATION_SPRAY | Freq: Four times a day (QID) | RESPIRATORY_TRACT | 3 refills | Status: DC | PRN

## 2017-10-21 MED ORDER — MONTELUKAST SODIUM 10 MG PO TABS: 10.0000 mg | ORAL_TABLET | Freq: Every day | ORAL | 1 refills | Status: AC

## 2017-10-21 MED ORDER — ALBUTEROL SULFATE (2.5 MG/3ML) 0.083% IN NEBU: INHALATION_SOLUTION | RESPIRATORY_TRACT | 6 refills | Status: AC

## 2017-10-21 NOTE — Patient Instructions (Signed)
Schedule a follow-up with GYN/ ask about your last Tdap; Review the Cologuard information and consider having the test;

## 2017-10-21 NOTE — Progress Notes (Signed)
Kristi Hendrix is a 52 y.o. female with the following history as recorded in Epic[employer]:  Patient Active Problem List   Diagnosis Date Noted  . Allergic reaction 02/10/2017  . Headache 02/20/2016  . Routine history and physical examination of adult 02/20/2016  . Viral URI with cough 06/20/2015  . Seasonal allergies 03/24/2015  . Laboratory exam ordered as part of routine general medical examination 03/13/2015  . Fatigue 03/13/2015  . S/P vaginal hysterectomy 11/27/2014  . Asthma 10/16/2014  . Hypothyroidism 10/16/2014    Current Outpatient Medications  Medication Sig Dispense Refill  . albuterol (PROVENTIL HFA;VENTOLIN HFA) 108 (90 Base) MCG/ACT inhaler Inhale into the lungs.    Marland Kitchen albuterol (PROVENTIL HFA;VENTOLIN HFA) 108 (90 Base) MCG/ACT inhaler Inhale 1-2 puffs into the lungs every 6 (six) hours as needed. Shortness of breath 1 Inhaler 3  . albuterol (PROVENTIL) (2.5 MG/3ML) 0.083% nebulizer solution 1 vial every 4-6 hours as needed    . albuterol (PROVENTIL) (2.5 MG/3ML) 0.083% nebulizer solution 1 vial every 4-6 hours as needed 30 vial 6  . budesonide-formoterol (SYMBICORT) 160-4.5 MCG/ACT inhaler Inhale 2 puffs into the lungs 2 (two) times daily. 1 Inhaler 3  . cycloSPORINE (RESTASIS) 0.05 % ophthalmic emulsion Place 1 drop into both eyes 2 (two) times daily.    . fluticasone (FLONASE) 50 MCG/ACT nasal spray PLACE 2 SPRAYS INTO BOTH NOSTRILS DAILY. 16 g 2  . hydrocortisone 2.5 % cream Apply topically 2 (two) times daily. 30 g 0  . levothyroxine (SYNTHROID, LEVOTHROID) 50 MCG tablet TAKE 1 TABLET BY MOUTH EVERY DAY BEFORE BREAKFAST 90 tablet 1  . Lifitegrast (XIIDRA) 5 % SOLN Xiidra 5 % eye drops in a dropperette    . montelukast (SINGULAIR) 10 MG tablet Take 1 tablet (10 mg total) by mouth at bedtime. 90 tablet 1  . naproxen (NAPROSYN) 500 MG tablet TAKE 1 TABLET TWO TIMES DAILY, AS NEEDED  2  . naproxen (NAPROSYN) 500 MG tablet Naprosyn 500 mg tablet  Take 1 tablet twice a day  by oral route as needed.    . pyridOXINE (VITAMIN B-6) 100 MG tablet Take by mouth.    . vitamin B-12 (CYANOCOBALAMIN) 1000 MCG tablet Take by mouth.     No current facility-administered medications for this visit.     Allergies: Aspirin; Codeine; and Zofran [ondansetron hcl]  Past Medical History:  Diagnosis Date  . Asthma   . Chicken pox   . Detached retina    left eye, will have surgery to repair after recovering from GYN surgery  . Headache    otc med prn  . History of bronchitis   . Hypothyroidism   . Seasonal allergies   . SVD (spontaneous vaginal delivery)    x 2  . UTI (lower urinary tract infection)     Past Surgical History:  Procedure Laterality Date  . ELBOW SURGERY     right  . TUBAL LIGATION    . VAGINAL HYSTERECTOMY N/A 11/27/2014   Procedure: HYSTERECTOMY VAGINAL;  Surgeon: Newton Pigg, MD;  Location: [facility];  Service: Gynecology;  Laterality: N/A;    Family History  Problem Relation Age of Onset  . Alcohol abuse Mother   . Alcohol abuse Father   . Diabetes Father   . Heart disease Maternal Grandfather     Social History   Tobacco Use  . Smoking status: Never Smoker  . Smokeless tobacco: Never Used  Substance Use Topics  . Alcohol use: Yes    Comment: Social  drinker    Subjective:  Presents for her yearly CPE today; is concerned that thyroid level is not correct as she is more tired recently; does note that she is working long hours- up at 3:30 and not home until about 4; is not fasting today and prefers to come back at later day;  Overdue for mammogram, colonoscopy- defers scheduling either; defers any type of vaccine;   Review of Systems  Constitutional: Positive for malaise/fatigue.  HENT: Negative.   Eyes: Negative.   Respiratory: Negative for cough and shortness of breath.   Cardiovascular: Negative.   Gastrointestinal: Negative.   Genitourinary: Negative.   Musculoskeletal: Negative.   Skin: Negative.   Neurological: Negative.    Psychiatric/Behavioral: Negative.      Objective:  Vitals:   10/21/17 1610  BP: 110/80  Pulse: 75  Temp: 98 F (36.7 C)  TempSrc: Oral  SpO2: 99%  Weight: 144 lb 1.9 oz (65.4 kg)  Height: 5' 1"  (1.549 m)    General: Well developed, well nourished, in no acute distress  Skin : Warm and dry.  Head: Normocephalic and atraumatic  Eyes: Sclera and conjunctiva clear; pupils round and reactive to light; extraocular movements intact  Ears: External normal; canals clear; tympanic membranes normal  Oropharynx: Pink, supple. No suspicious lesions  Neck: Supple without thyromegaly, adenopathy  Lungs: Respirations unlabored; clear to auscultation bilaterally without wheeze, rales, rhonchi  CVS exam: normal rate and regular rhythm.  Abdomen: Soft; nontender; nondistended; normoactive bowel sounds; no masses or hepatosplenomegaly  Musculoskeletal: No deformities; no active joint inflammation  Extremities: No edema, cyanosis, clubbing  Vessels: Symmetric bilaterally  Neurologic: Alert and oriented; speech intact; face symmetrical; moves all extremities well; CNII-XII intact without focal deficit  Assessment:  1. PE (physical exam), annual   2. Other fatigue   3. Lipid screening   4. Hypothyroidism, unspecified type     Plan:  Orders placed for  CBC, CMP, lipids, TSH- she will return next week to get these done; medication refills updated; patient defers scheduling for mammogram or colonoscopy- I did give her Cologuard information to consider as colonoscopy alternative; she notes she also plans to see her GYN in follow-up; she defers vaccines today as well; follow up to be determined.   No follow-ups on file.  Orders Placed This Encounter  Procedures  . CBC w/Diff    Standing Status:   Future    Standing Expiration Date:   10/21/2018  . Comp Met (CMET)    Standing Status:   Future    Standing Expiration Date:   10/21/2018  . Lipid panel    Standing Status:   Future    Standing  Expiration Date:   10/22/2018  . TSH    Standing Status:   Future    Standing Expiration Date:   10/21/2018    Requested Prescriptions   Signed Prescriptions Disp Refills  . budesonide-formoterol (SYMBICORT) 160-4.5 MCG/ACT inhaler 1 Inhaler 3    Sig: Inhale 2 puffs into the lungs 2 (two) times daily.  . montelukast (SINGULAIR) 10 MG tablet 90 tablet 1    Sig: Take 1 tablet (10 mg total) by mouth at bedtime.  Marland Kitchen albuterol (PROVENTIL HFA;VENTOLIN HFA) 108 (90 Base) MCG/ACT inhaler 1 Inhaler 3    Sig: Inhale 1-2 puffs into the lungs every 6 (six) hours as needed. Shortness of breath  . albuterol (PROVENTIL) (2.5 MG/3ML) 0.083% nebulizer solution 30 vial 6    Sig: 1 vial every 4-6 hours as needed

## 2017-10-24 IMAGING — CT CT HEAD W/O CM
3 series · 15 of 44 positions shown, 18 images · non-contrast
Comparison: None.

CLINICAL DATA: Severe tension headaches for several weeks

EXAM:
CT HEAD WITHOUT CONTRAST
TECHNIQUE: Contiguous axial images were obtained from the base of the skull
through the vertex without intravenous contrast.

[Series 2: head 5.0 h37s · axial · 0.41mm/px · z∈[+159,+269]mm · 9 of 27 slices shown, 12 images]
[im 3/27  brain]
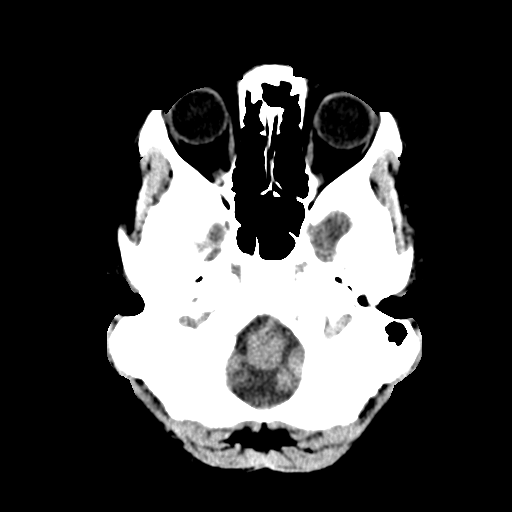
[im 3/27  bone]
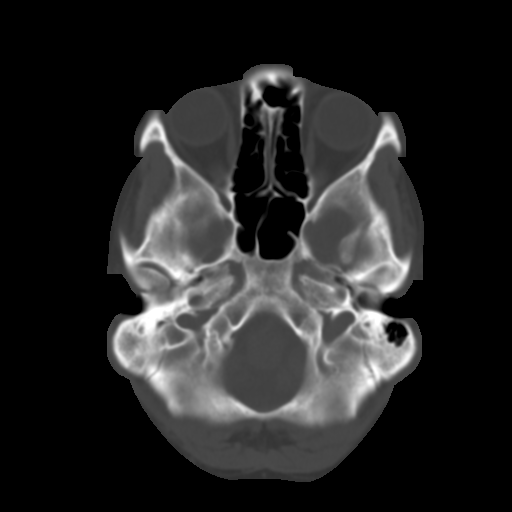
[im 6/27  brain]
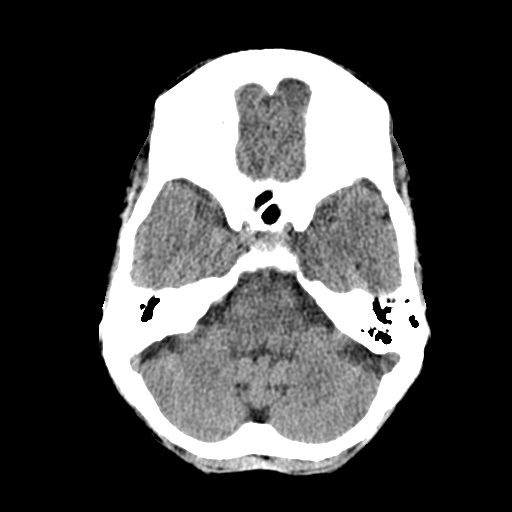
[im 8/27  brain]
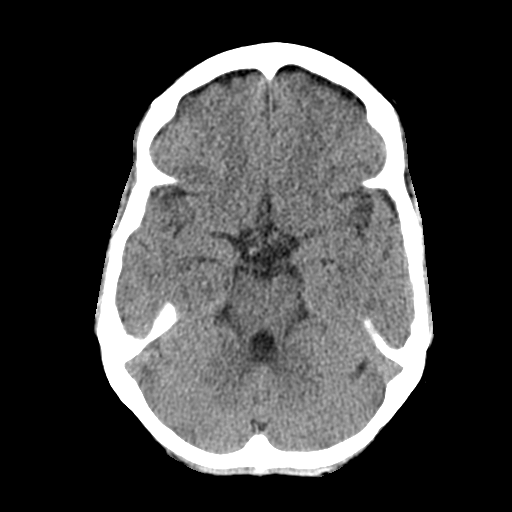
[im 11/27  brain]
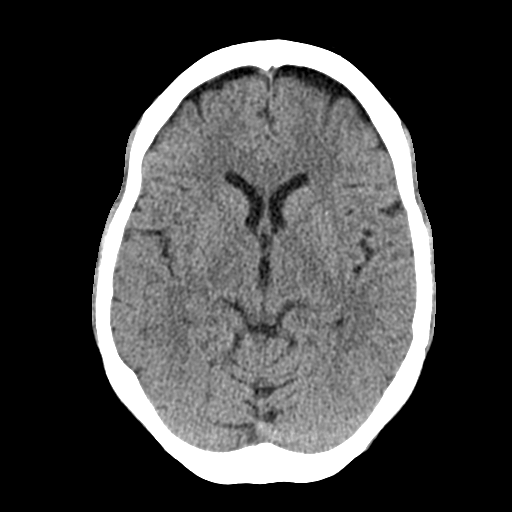
[im 14/27  brain]
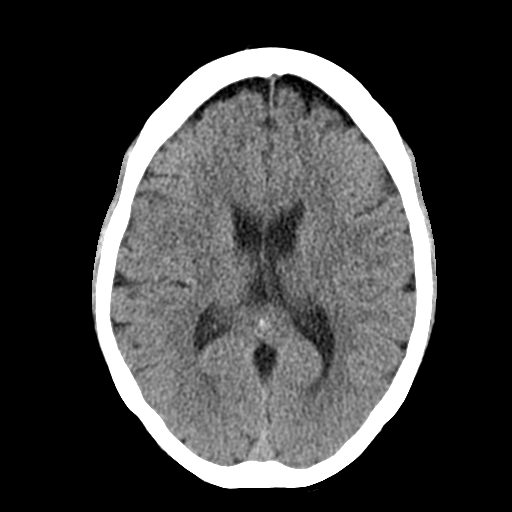
[im 14/27  bone]
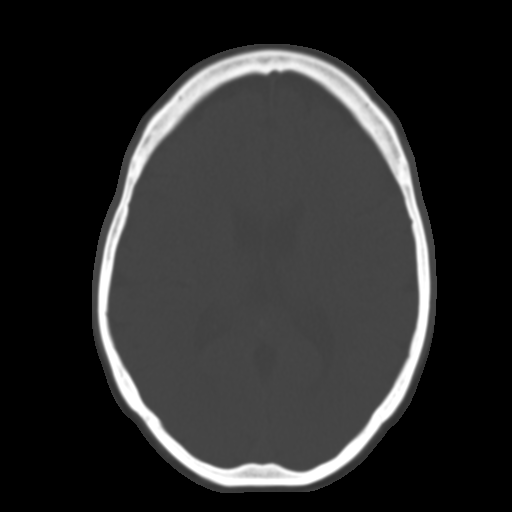
[im 17/27  brain]
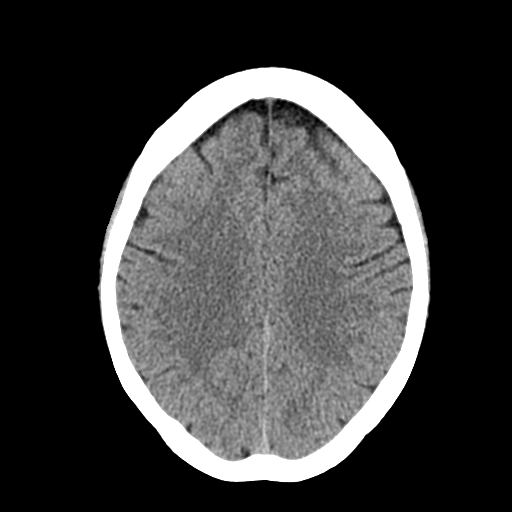
[im 20/27  brain]
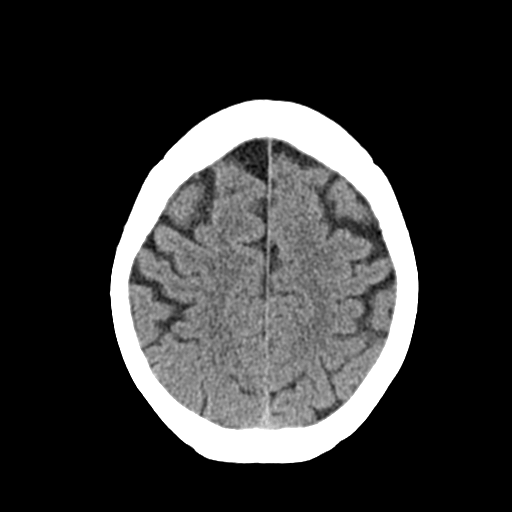
[im 22/27  brain]
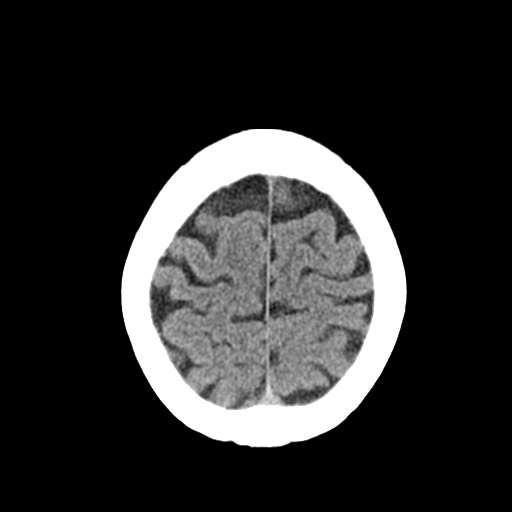
[im 25/27  brain]
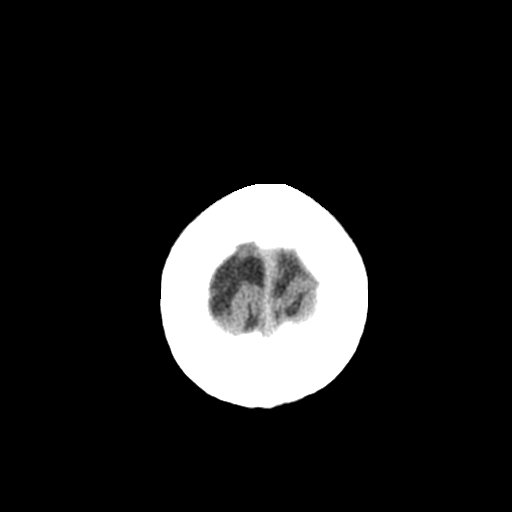
[im 25/27  bone]
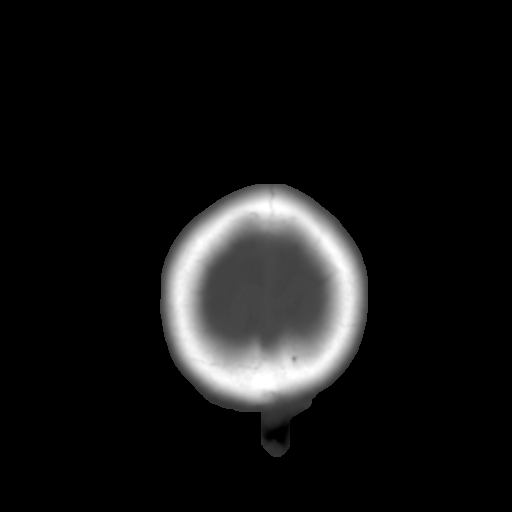

[Series 4: head 3.0 mpr sag · sagittal · 0.27mm/px · 3 of 50 slices shown]
[im 17/50  brain]
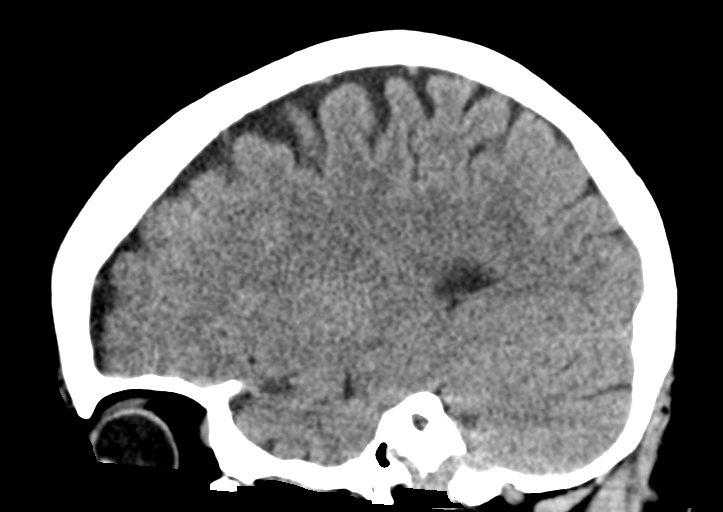
[im 25/50  brain]
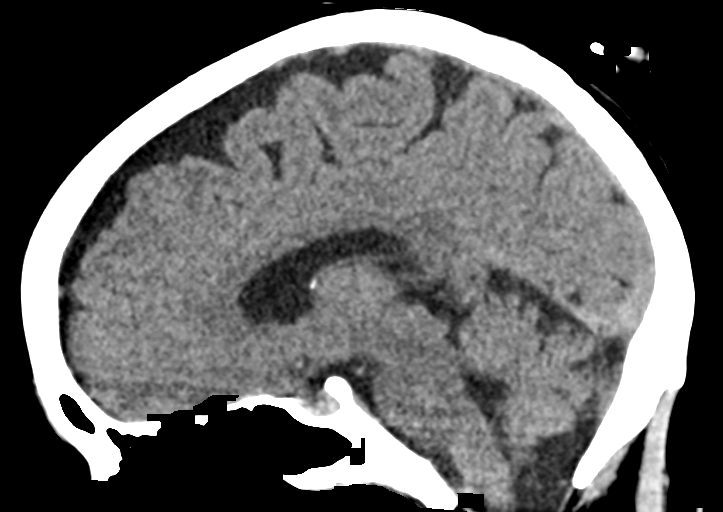
[im 33/50  brain]
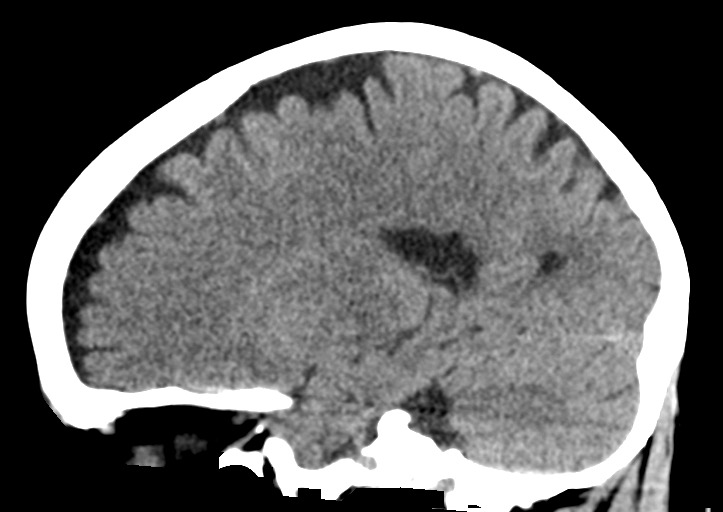

[Series 5: head 3.0 mpr cor · coronal · 0.28mm/px · 3 of 63 slices shown]
[im 21/63  brain]
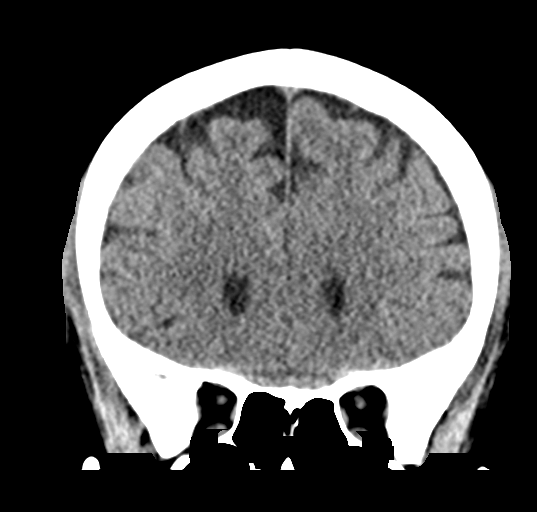
[im 28/63  brain]
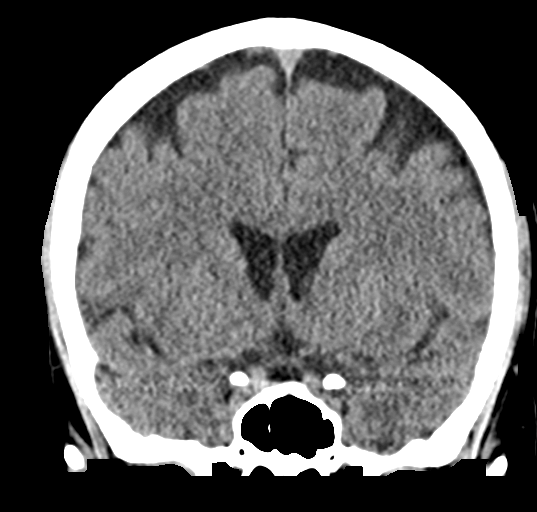
[im 35/63  brain]
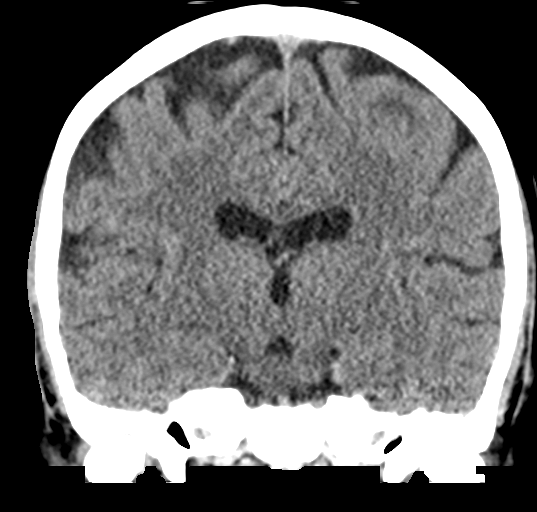

[15 of 44 positions shown; findings below may reference images not displayed]

FINDINGS: Brain: The ventricular system is normal in size and configuration,
and the septum is in a normal midline position. The fourth ventricle
and basilar cisterns are unremarkable. No hemorrhage, mass lesion,
or acute infarction is seen.

Vascular: On this unenhanced study, no vascular abnormality is
evident.

Skull: No calvarial abnormality is seen.

Sinuses/Orbits: The paranasal sinuses that are visualized are well
pneumatized.

Other: None
IMPRESSION: Negative unenhanced CT of the brain.

## 2017-12-24 ENCOUNTER — Other Ambulatory Visit: Payer: Self-pay | Admitting: Family Medicine

## 2017-12-24 DIAGNOSIS — R1031 Right lower quadrant pain: Secondary | ICD-10-CM

## 2017-12-31 ENCOUNTER — Other Ambulatory Visit: Payer: BLUE CROSS/BLUE SHIELD

## 2018-04-12 ENCOUNTER — Encounter: Payer: Self-pay | Admitting: Nurse Practitioner

## 2018-04-12 ENCOUNTER — Ambulatory Visit: Payer: BLUE CROSS/BLUE SHIELD | Admitting: Nurse Practitioner

## 2018-04-12 ENCOUNTER — Other Ambulatory Visit (INDEPENDENT_AMBULATORY_CARE_PROVIDER_SITE_OTHER): Payer: BLUE CROSS/BLUE SHIELD

## 2018-04-12 VITALS — BP 134/88 | HR 89 | Temp 98.5°F | Ht 61.0 in | Wt 143.0 lb

## 2018-04-12 DIAGNOSIS — E039 Hypothyroidism, unspecified: Secondary | ICD-10-CM

## 2018-04-12 DIAGNOSIS — R5383 Other fatigue: Secondary | ICD-10-CM | POA: Diagnosis not present

## 2018-04-12 DIAGNOSIS — J45909 Unspecified asthma, uncomplicated: Secondary | ICD-10-CM | POA: Diagnosis not present

## 2018-04-12 DIAGNOSIS — H6690 Otitis media, unspecified, unspecified ear: Secondary | ICD-10-CM

## 2018-04-12 LAB — CBC WITH DIFFERENTIAL/PLATELET
Basophils Absolute: 0.1 10*3/uL (ref 0.0–0.1)
Basophils Relative: 1 % (ref 0.0–3.0)
EOS PCT: 1.8 % (ref 0.0–5.0)
Eosinophils Absolute: 0.1 10*3/uL (ref 0.0–0.7)
HEMATOCRIT: 40.1 % (ref 36.0–46.0)
HEMOGLOBIN: 13.6 g/dL (ref 12.0–15.0)
Lymphocytes Relative: 30.4 % (ref 12.0–46.0)
Lymphs Abs: 1.8 10*3/uL (ref 0.7–4.0)
MCHC: 34 g/dL (ref 30.0–36.0)
MCV: 90.3 fl (ref 78.0–100.0)
MONO ABS: 0.5 10*3/uL (ref 0.1–1.0)
MONOS PCT: 8.2 % (ref 3.0–12.0)
NEUTROS PCT: 58.6 % (ref 43.0–77.0)
Neutro Abs: 3.5 10*3/uL (ref 1.4–7.7)
Platelets: 338 10*3/uL (ref 150.0–400.0)
RBC: 4.44 Mil/uL (ref 3.87–5.11)
RDW: 13.3 % (ref 11.5–15.5)
WBC: 5.9 10*3/uL (ref 4.0–10.5)

## 2018-04-12 LAB — COMPREHENSIVE METABOLIC PANEL
ALBUMIN: 4.5 g/dL (ref 3.5–5.2)
ALK PHOS: 63 U/L (ref 39–117)
ALT: 21 U/L (ref 0–35)
AST: 23 U/L (ref 0–37)
BUN: 25 mg/dL — AB (ref 6–23)
CHLORIDE: 107 meq/L (ref 96–112)
CO2: 25 mEq/L (ref 19–32)
Calcium: 9.6 mg/dL (ref 8.4–10.5)
Creatinine, Ser: 1.21 mg/dL — ABNORMAL HIGH (ref 0.40–1.20)
GFR: 49.66 mL/min — AB (ref >60.00)
Glucose, Bld: 98 mg/dL (ref 70–99)
POTASSIUM: 4.4 meq/L (ref 3.5–5.1)
SODIUM: 139 meq/L (ref 135–145)
Total Bilirubin: 0.2 mg/dL (ref 0.2–1.2)
Total Protein: 7.6 g/dL (ref 6.0–8.3)

## 2018-04-12 LAB — TSH: TSH: 1.72 u[IU]/mL (ref 0.35–4.50)

## 2018-04-12 MED ORDER — ALBUTEROL SULFATE HFA 108 (90 BASE) MCG/ACT IN AERS: 1.0000 | INHALATION_SPRAY | Freq: Four times a day (QID) | RESPIRATORY_TRACT | 3 refills | Status: AC | PRN

## 2018-04-12 MED ORDER — AMOXICILLIN-POT CLAVULANATE 875-125 MG PO TABS: 1.0000 | ORAL_TABLET | Freq: Two times a day (BID) | ORAL | 0 refills | Status: AC

## 2018-04-12 MED ORDER — BUDESONIDE-FORMOTEROL FUMARATE 160-4.5 MCG/ACT IN AERO: 2.0000 | INHALATION_SPRAY | Freq: Two times a day (BID) | RESPIRATORY_TRACT | 3 refills | Status: AC

## 2018-04-12 NOTE — Progress Notes (Signed)
Kristi Hendrix is a 52 y.o. female with the following history as recorded in EpicCare:  Patient Active Problem List   Diagnosis Date Noted  . Allergic reaction 02/10/2017  . Headache 02/20/2016  . Routine history and physical examination of adult 02/20/2016  . Viral URI with cough 06/20/2015  . Seasonal allergies 03/24/2015  . Laboratory exam ordered as part of routine general medical examination 03/13/2015  . Fatigue 03/13/2015  . S/P vaginal hysterectomy 11/27/2014  . Asthma 10/16/2014  . Hypothyroidism 10/16/2014    Current Outpatient Medications  Medication Sig Dispense Refill  . albuterol (PROVENTIL HFA;VENTOLIN HFA) 108 (90 Base) MCG/ACT inhaler Inhale 1-2 puffs into the lungs every 6 (six) hours as needed. Shortness of breath 1 Inhaler 3  . albuterol (PROVENTIL) (2.5 MG/3ML) 0.083% nebulizer solution 1 vial every 4-6 hours as needed    . albuterol (PROVENTIL) (2.5 MG/3ML) 0.083% nebulizer solution 1 vial every 4-6 hours as needed 30 vial 6  . budesonide-formoterol (SYMBICORT) 160-4.5 MCG/ACT inhaler Inhale 2 puffs into the lungs 2 (two) times daily. 1 Inhaler 3  . cycloSPORINE (RESTASIS) 0.05 % ophthalmic emulsion Place 1 drop into both eyes 2 (two) times daily.    . fluticasone (FLONASE) 50 MCG/ACT nasal spray PLACE 2 SPRAYS INTO BOTH NOSTRILS DAILY. 16 g 2  . levothyroxine (SYNTHROID, LEVOTHROID) 50 MCG tablet TAKE 1 TABLET BY MOUTH EVERY DAY BEFORE BREAKFAST 90 tablet 1  . Lifitegrast (XIIDRA) 5 % SOLN Xiidra 5 % eye drops in a dropperette    . montelukast (SINGULAIR) 10 MG tablet Take 1 tablet (10 mg total) by mouth at bedtime. 90 tablet 1  . naproxen (NAPROSYN) 500 MG tablet TAKE 1 TABLET TWO TIMES DAILY, AS NEEDED  2  . naproxen (NAPROSYN) 500 MG tablet Naprosyn 500 mg tablet  Take 1 tablet twice a day by oral route as needed.    . pyridOXINE (VITAMIN B-6) 100 MG tablet Take by mouth.    . vitamin B-12 (CYANOCOBALAMIN) 1000 MCG tablet Take by mouth.    Marland Kitchen  amoxicillin-clavulanate (AUGMENTIN) 875-125 MG tablet Take 1 tablet by mouth 2 (two) times daily. 14 tablet 0  . hydrocortisone 2.5 % cream Apply topically 2 (two) times daily. (Patient not taking: Reported on 04/12/2018) 30 g 0   No current facility-administered medications for this visit.     Allergies: Aspirin; Codeine; and Zofran [ondansetron hcl]  Past Medical History:  Diagnosis Date  . Asthma   . Chicken pox   . Detached retina    left eye, will have surgery to repair after recovering from GYN surgery  . Headache    otc med prn  . History of bronchitis   . Hypothyroidism   . Seasonal allergies   . SVD (spontaneous vaginal delivery)    x 2  . UTI (lower urinary tract infection)     Past Surgical History:  Procedure Laterality Date  . ELBOW SURGERY     right  . TUBAL LIGATION    . VAGINAL HYSTERECTOMY N/A 11/27/2014   Procedure: HYSTERECTOMY VAGINAL;  Surgeon: Newton Pigg, MD;  Location: [facility];  Service: Gynecology;  Laterality: N/A;    Family History  Problem Relation Age of Onset  . Alcohol abuse Mother   . Alcohol abuse Father   . Diabetes Father   . Heart disease Maternal Grandfather     Social History   Tobacco Use  . Smoking status: Never Smoker  . Smokeless tobacco: Never Used  Substance Use Topics  .  Alcohol use: Yes    Comment: Social drinker     Subjective:  Kristi Hendrix is here today requesting evaluation of an acute complaint of ear pain, which first began about 2 months ago, to bilateral ears. 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  She has been taking singulair, flonase daily at home with no relief. Not a smoker  She  also requests refill of inhalers for asthma today-symbicort,albuterol, reports using inhalers as prescribed with no adverse effects.  ROS- See HPI  Objective:  Vitals:   04/12/18 1527  BP: 134/88  Pulse: 89  Temp: 98.5 F (36.9 C)  TempSrc: Oral  SpO2: 98%  Weight: 143 lb (64.9 kg)  Height: 5\' 1"  (1.549 m)    General: Well developed, well nourished, in no acute distress  Skin : Warm and dry.  Head: Normocephalic and atraumatic  Eyes: Sclera and conjunctiva clear; pupils round and reactive to light; extraocular movements intact  Ears: External normal; canals clear; tympanic membranes cloudy, erythema to left external ear canal Oropharynx: Pink, supple. No suspicious lesions  Neck: Supple without thyromegaly, adenopathy  Lungs: Respirations unlabored; clear to auscultation bilaterally without wheeze, rales, rhonchi  CVS exam: normal rate, regular rhythm, distal pulses intact  Neurologic: Alert and oriented; speech intact; face symmetrical; moves all extremities well; CNII-XII intact without focal deficit    Assessment:  1. Acute otitis media, unspecified otitis media type   2. Asthma, unspecified asthma severity, unspecified whether complicated, unspecified whether persistent     Plan:   Acute otitis media, unspecified otitis media type Will treat with course of abx- dosing and side effects discussed Home management, red flags and return precautions including when to seek immediate care discussed and printed on AVS   No follow-ups on file.  No orders of the defined types were placed in this encounter.   Requested Prescriptions   Signed Prescriptions Disp Refills  . budesonide-formoterol (SYMBICORT) 160-4.5 MCG/ACT inhaler 1 Inhaler 3    Sig: Inhale 2 puffs into the lungs 2 (two) times daily.  Marland Kitchen albuterol (PROVENTIL HFA;VENTOLIN HFA) 108 (90 Base) MCG/ACT inhaler 1 Inhaler 3    Sig: Inhale 1-2 puffs into the lungs every 6 (six) hours as needed. Shortness of breath  .  amoxicillin-clavulanate (AUGMENTIN) 875-125 MG tablet 14 tablet 0    Sig: Take 1 tablet by mouth 2 (two) times daily.

## 2018-04-12 NOTE — Patient Instructions (Signed)
Please take augmentin as prescribed  Please follow up for fevers over 101, if your symptoms get worse, or if your symptoms dont get better with the antibiotic.   Otitis Media, Adult Otitis media is redness, soreness, and puffiness (swelling) in the space just behind your eardrum (middle ear). It may be caused by allergies or infection. It often happens along with a cold. Follow these instructions at home:  Take your medicine as told. Finish it even if you start to feel better.  Only take over-the-counter or prescription medicines for pain, discomfort, or fever as told by your doctor.  Follow up with your doctor as told. Contact a doctor if:  You have otitis media only in one ear, or bleeding from your nose, or both.  You notice a lump on your neck.  You are not getting better in 3-5 days.  You feel worse instead of better. Get help right away if:  You have pain that is not helped with medicine.  You have puffiness, redness, or pain around your ear.  You get a stiff neck.  You cannot move part of your face (paralysis).  You notice that the bone behind your ear hurts when you touch it. This information is not intended to replace advice given to you by your health care provider. Make sure you discuss any questions you have with your health care provider. Document Released: 12/10/2007 Document Revised: 11/29/2015 Document Reviewed: 01/18/2013 Elsevier Interactive Patient Education  2017 [employer].

## 2018-04-12 NOTE — Assessment & Plan Note (Signed)
Stable Continue current medications F/u for new, worsening symptoms 

## 2018-04-14 ENCOUNTER — Other Ambulatory Visit: Payer: Self-pay | Admitting: Family

## 2018-07-25 ENCOUNTER — Encounter: Payer: Self-pay | Admitting: Family

## 2018-07-26 ENCOUNTER — Other Ambulatory Visit: Payer: Self-pay | Admitting: Family

## 2018-07-26 MED ORDER — LEVOTHYROXINE SODIUM 50 MCG PO TABS: ORAL_TABLET | ORAL | 1 refills | Status: DC

## 2018-09-02 ENCOUNTER — Encounter: Payer: Self-pay | Admitting: Nurse Practitioner

## 2018-09-02 ENCOUNTER — Encounter: Payer: Self-pay | Admitting: Family

## 2018-09-03 ENCOUNTER — Other Ambulatory Visit: Payer: Self-pay | Admitting: Family

## 2018-09-03 DIAGNOSIS — E039 Hypothyroidism, unspecified: Secondary | ICD-10-CM

## 2018-09-08 ENCOUNTER — Telehealth: Payer: Self-pay

## 2018-09-08 ENCOUNTER — Other Ambulatory Visit (INDEPENDENT_AMBULATORY_CARE_PROVIDER_SITE_OTHER): Payer: BLUE CROSS/BLUE SHIELD

## 2018-09-08 DIAGNOSIS — E039 Hypothyroidism, unspecified: Secondary | ICD-10-CM

## 2018-09-08 LAB — TSH: TSH: 1.76 u[IU]/mL (ref 0.35–4.50)

## 2018-09-08 NOTE — Telephone Encounter (Signed)
Lab called and said that if you get lab results for this patient to forward them to [facility]. They accidentally put them under your name some how.

## 2018-10-21 ENCOUNTER — Encounter: Payer: Self-pay | Admitting: Family

## 2019-01-18 ENCOUNTER — Other Ambulatory Visit: Payer: Self-pay | Admitting: Family

## 2019-01-18 MED ORDER — LEVOTHYROXINE SODIUM 50 MCG PO TABS: ORAL_TABLET | ORAL | 1 refills | Status: DC

## 2019-07-14 ENCOUNTER — Other Ambulatory Visit: Payer: Self-pay | Admitting: Family

## 2019-07-14 ENCOUNTER — Encounter: Payer: Self-pay | Admitting: Family

## 2019-07-14 MED ORDER — LEVOTHYROXINE SODIUM 50 MCG PO TABS: ORAL_TABLET | ORAL | 0 refills | Status: AC
